# Patient Record
Sex: Female | Born: 1975 | Race: Black or African American | Hispanic: No | Marital: Married | Smoking: Never smoker
Health system: Southern US, Community
[De-identification: ages and names within clinical notes are randomized; demographics above are authoritative.]

## PROBLEM LIST (undated history)

## (undated) DIAGNOSIS — IMO0002 Reserved for concepts with insufficient information to code with codable children: Secondary | ICD-10-CM

## (undated) DIAGNOSIS — G4730 Sleep apnea, unspecified: Secondary | ICD-10-CM

## (undated) DIAGNOSIS — D649 Anemia, unspecified: Secondary | ICD-10-CM

## (undated) DIAGNOSIS — E8881 Metabolic syndrome: Secondary | ICD-10-CM

## (undated) DIAGNOSIS — E282 Polycystic ovarian syndrome: Secondary | ICD-10-CM

## (undated) DIAGNOSIS — M5126 Other intervertebral disc displacement, lumbar region: Secondary | ICD-10-CM

## (undated) HISTORY — DX: Sleep apnea, unspecified: G47.30

## (undated) HISTORY — DX: Other intervertebral disc displacement, lumbar region: M51.26

## (undated) HISTORY — DX: Reserved for concepts with insufficient information to code with codable children: IMO0002

## (undated) HISTORY — DX: Anemia, unspecified: D64.9

## (undated) HISTORY — DX: Metabolic syndrome: E88.81

## (undated) HISTORY — DX: Polycystic ovarian syndrome: E28.2

---

## 2006-06-19 ENCOUNTER — Emergency Department (HOSPITAL_COMMUNITY): Admission: EM | Admit: 2006-06-19 | Discharge: 2006-06-19 | Payer: Self-pay | Admitting: *Deleted

## 2011-10-29 ENCOUNTER — Other Ambulatory Visit (HOSPITAL_BASED_OUTPATIENT_CLINIC_OR_DEPARTMENT_OTHER): Payer: Self-pay | Admitting: Family Medicine

## 2011-10-30 ENCOUNTER — Ambulatory Visit (INDEPENDENT_AMBULATORY_CARE_PROVIDER_SITE_OTHER)
Admission: RE | Admit: 2011-10-30 | Discharge: 2011-10-30 | Disposition: A | Discharge status: Home / Self Care | Payer: Private Health Insurance - Indemnity | Source: Ambulatory Visit | Attending: Family Medicine | Admitting: Family Medicine

## 2011-10-30 ENCOUNTER — Ambulatory Visit (HOSPITAL_BASED_OUTPATIENT_CLINIC_OR_DEPARTMENT_OTHER)
Admission: RE | Admit: 2011-10-30 | Discharge: 2011-10-30 | Disposition: A | Discharge status: Home / Self Care | Payer: Private Health Insurance - Indemnity | Source: Ambulatory Visit | Attending: Family Medicine | Admitting: Family Medicine

## 2011-10-30 ENCOUNTER — Other Ambulatory Visit (HOSPITAL_BASED_OUTPATIENT_CLINIC_OR_DEPARTMENT_OTHER): Payer: Self-pay | Admitting: Family Medicine

## 2011-10-30 DIAGNOSIS — R1032 Left lower quadrant pain: Secondary | ICD-10-CM

## 2011-10-30 DIAGNOSIS — R319 Hematuria, unspecified: Secondary | ICD-10-CM

## 2011-11-19 ENCOUNTER — Encounter: Payer: Self-pay | Admitting: Internal Medicine

## 2011-11-19 ENCOUNTER — Ambulatory Visit (INDEPENDENT_AMBULATORY_CARE_PROVIDER_SITE_OTHER): Payer: Private Health Insurance - Indemnity | Admitting: Internal Medicine

## 2011-11-19 DIAGNOSIS — G4730 Sleep apnea, unspecified: Secondary | ICD-10-CM

## 2011-11-19 DIAGNOSIS — M5126 Other intervertebral disc displacement, lumbar region: Secondary | ICD-10-CM

## 2011-11-19 DIAGNOSIS — E282 Polycystic ovarian syndrome: Secondary | ICD-10-CM

## 2011-11-19 DIAGNOSIS — M109 Gout, unspecified: Secondary | ICD-10-CM

## 2011-11-19 DIAGNOSIS — N979 Female infertility, unspecified: Secondary | ICD-10-CM

## 2011-11-19 LAB — CBC WITH DIFFERENTIAL/PLATELET
Eosinophils Absolute: 0.2 10*3/uL (ref 0.0–0.7)
Hemoglobin: 11.6 g/dL — ABNORMAL LOW (ref 12.0–15.0)
Lymphs Abs: 3.9 10*3/uL (ref 0.7–4.0)
MCH: 27.2 pg (ref 26.0–34.0)
Monocytes Relative: 7 % (ref 3–12)
Neutro Abs: 7.3 10*3/uL (ref 1.7–7.7)
Neutrophils Relative %: 60 % (ref 43–77)
RBC: 4.26 MIL/uL (ref 3.87–5.11)

## 2011-11-19 LAB — COMPREHENSIVE METABOLIC PANEL
Albumin: 4 g/dL (ref 3.5–5.2)
CO2: 27 mEq/L (ref 19–32)
Calcium: 9.4 mg/dL (ref 8.4–10.5)
Chloride: 105 mEq/L (ref 96–112)
Glucose, Bld: 68 mg/dL — ABNORMAL LOW (ref 70–99)
Potassium: 4.3 mEq/L (ref 3.5–5.3)
Sodium: 140 mEq/L (ref 135–145)
Total Protein: 7.2 g/dL (ref 6.0–8.3)

## 2011-11-19 LAB — URIC ACID: Uric Acid, Serum: 6.8 mg/dL (ref 2.4–7.0)

## 2011-11-19 LAB — TSH: TSH: 0.761 u[IU]/mL (ref 0.350–4.500)

## 2011-11-19 NOTE — Progress Notes (Signed)
  Subjective:    Patient ID: Sabrina Avery, female    DOB: 04-14-1976, 35 y.o.   MRN: [id number]  HPI  New pt here for first visit.    Former care at [facility] ?  Dr. Katrinka Blazing, GYn Dr. Lindalou Hose in [place] and she sees an infertility specialist.  PMH of PCOS, infertility, obesity,   OSA but does not use CPAP, remote HNP lower back, and she was told by her Daymon Larsen MD that she may have gout  She is concerned over two issues. She was told at [facility] that her uric acid level was high.  Shehas never had any swollen red or hot joints.  She was not sure what this means  She also would like to talk to a nutritionist about wtl loss issues.  She has tried weight watchers in the past  No Known Allergies Past Medical History  Diagnosis Date  . Gout   . Insulin resistance   . Sleep apnea    History reviewed. No pertinent past surgical history. History   Social History  . Marital Status: Married    Spouse Name: N/A    Number of Children: N/A  . Years of Education: N/A   Occupational History  . Not on file.   Social History Main Topics  . Smoking status: Never Smoker   . Smokeless tobacco: Never Used  . Alcohol Use: No  . Drug Use: No  . Sexually Active: Yes    Birth Control/ Protection: None   Other Topics Concern  . Not on file   Social History Narrative  . No narrative on file   Family History  Problem Relation Age of Onset  . Hyperlipidemia Mother   . Diabetes Father   . Hypertension Brother    There is no problem list on file for this patient.  No current outpatient prescriptions on file prior to visit.      Review of Systems    see hpi Objective:   Physical Exam Physical Exam  Nursing note and vitals reviewed.  Constitutional: She is oriented to person, place, and time. She appears well-developed and well-nourished.  HENT:  Head: Normocephalic and atraumatic.  Cardiovascular: Normal rate and regular rhythm. Exam reveals no gallop and no friction rub.  No murmur  heard.  Pulmonary/Chest: Breath sounds normal. She has no wheezes. She has no rales.  Neurological: She is alert and oriented to person, place, and time.  Skin: Skin is warm and dry.  Psychiatric: She has a normal mood and affect. Her behavior is normal.  M/S  I see not acitve synovitis to any of her joints            Assessment & Plan:  1)  Elevated uric acid.  Will recheck today.  Gout actually not confirmed as she has never had any active synovitis to any joint 2)  Obesity  Will refer to nutritionist 3)  PCOS  On Metformin 4)  Infertility 5)  Remote HNP lower back

## 2011-11-19 NOTE — Patient Instructions (Signed)
Will make referral to nutritionist for weight loss  Sign old records GYN MD and Elana Alm care MD  Schedule CPE  For 2013  Lams will be mailed to you

## 2011-11-20 ENCOUNTER — Encounter: Payer: Self-pay | Admitting: Emergency Medicine

## 2011-11-20 DIAGNOSIS — N979 Female infertility, unspecified: Secondary | ICD-10-CM | POA: Insufficient documentation

## 2011-12-24 ENCOUNTER — Encounter: Payer: Private Health Insurance - Indemnity | Attending: Internal Medicine | Admitting: *Deleted

## 2011-12-24 ENCOUNTER — Encounter: Payer: Self-pay | Admitting: *Deleted

## 2011-12-24 DIAGNOSIS — Z713 Dietary counseling and surveillance: Secondary | ICD-10-CM | POA: Insufficient documentation

## 2011-12-24 NOTE — Progress Notes (Signed)
  Medical Nutrition Therapy:  Appt start time: 1530 end time:  1630.   Assessment:  Primary concerns today: Patient here for help with weight loss. She works full time and eats out often with husband. She is on Metformin for PCOS. Not physically active at this time   MEDICATIONS: see list.    DIETARY INTAKE:  Usual eating pattern includes 2-3 meals and 1-3 snacks per day.  Everyday foods include fair variety of most food groups.  Avoided foods include beef and pork.    24-hr recall:  B ( AM): used to skip, now: yogurt OR Chic Filet Burrito or chix biscuit  Snk ( AM): occasionally yogurt, crax and string cheese  L ( PM): home made soup with chix and veg, OR fast food restaurant Snk ( PM): occasionally yogurt or string cheese D ( PM): lean meat; chicken or fish, starch, vegetables, salad (raspberry or vidalia onion) Snk ( PM): none Beverages: tea, water, small sips of soda  Usual physical activity: Plans to do sit ups, push ups and weights 5 days a week. Currently doing twice weekly  Estimated energy needs: 1400 calories 158 g carbohydrates 105 g protein 39 g fat  Progress Towards Goal(s):  In progress.   Nutritional Diagnosis:  NI-1.5 Excessive energy intake As related to obesity.  As evidenced by BMI of 46.6%.    Intervention:  Nutrition counseling provided on macronutrients and balance for appropriate weight loss. Recommend she aim for 30-45 grams Carb / meal and 0-15 grams for snacks if hungry. Discussed value of choosing lower fat foods and avoiding fried foods Also discussed value of increasing her activity level as tolerated to 5 days/week to provide weight loss of 1-2 pounds per week . Goals: Aim for 2-3 Carbs / meal, 0-1 / snack if hungry  Read Food Labels for Total Carb of foods at home Eat half of meal in restaurant and bring other half home for another meal later Look for opportunities to increase activity level by walking, or classes at gym  Handouts given during  visit include:  Carb Counting and Beyond handout, Label Reading handout  Menu Planner to practice food choices  Monitoring/Evaluation:  Dietary intake, exercise, reading food labels, and body weight prn.

## 2011-12-24 NOTE — Patient Instructions (Addendum)
Goals: Aim for 2-3 Carbs / meal, 0-1 / snack if hungry  Read Food Labels for Total Carb of foods at home Eat half of meal in restaurant and bring other half home for another meal later Look for opportunities to increase activity level by walking, or classes at gym

## 2011-12-25 ENCOUNTER — Encounter: Payer: Self-pay | Admitting: *Deleted

## 2012-01-21 ENCOUNTER — Encounter: Payer: Self-pay | Admitting: Internal Medicine

## 2012-01-21 ENCOUNTER — Ambulatory Visit (INDEPENDENT_AMBULATORY_CARE_PROVIDER_SITE_OTHER): Payer: Private Health Insurance - Indemnity | Admitting: Internal Medicine

## 2012-01-21 VITALS — BP 110/70 | HR 101 | Temp 97.2°F | Ht 66.0 in | Wt 286.0 lb

## 2012-01-21 DIAGNOSIS — E282 Polycystic ovarian syndrome: Secondary | ICD-10-CM

## 2012-01-21 DIAGNOSIS — Z0000 Encounter for general adult medical examination without abnormal findings: Secondary | ICD-10-CM

## 2012-01-21 DIAGNOSIS — L304 Erythema intertrigo: Secondary | ICD-10-CM | POA: Insufficient documentation

## 2012-01-21 DIAGNOSIS — L538 Other specified erythematous conditions: Secondary | ICD-10-CM

## 2012-01-21 LAB — POCT URINALYSIS DIPSTICK
Nitrite, UA: NEGATIVE
Urobilinogen, UA: NEGATIVE
pH, UA: 6

## 2012-01-21 MED ORDER — NYSTATIN-TRIAMCINOLONE 100000-0.1 UNIT/GM-% EX OINT: TOPICAL_OINTMENT | Freq: Two times a day (BID) | CUTANEOUS | Status: DC

## 2012-01-21 NOTE — Patient Instructions (Signed)
Increase exercise.  Follow dash diet  See me as needed

## 2012-01-21 NOTE — Progress Notes (Signed)
Subjective:    Patient ID: Sabrina Avery, female    DOB: 12-18-1976, 36 y.o.   MRN: [id number]  HPI  [name] is here for comprehensive exam.  Overall doing well.  She did see nutritionist who recommended diet for her - she has not started an exercise program as yet.   She was inquiring about a Candida diet.    She has had long standing skin rash that she has seen several dermatolgists for in [place].  She currently has an itchy rash under both breasts.  Long standing.  She also has several dark spots flat on breasts, hips, and lower abd.  She is under care of a dermatolgist for this.   No redness or swelling in joints.    She states she will schedule a pap smear appt next month with her GYN in [place]  No Known Allergies Past Medical History  Diagnosis Date  . Insulin resistance   . Sleep apnea   . Gout     high uric acid.  per pt report  . Lumbar disc herniation     per pt report  . Polycystic ovarian syndrome   . Sleep apnea syndrome    No past surgical history on file. History   Social History  . Marital Status: Married    Spouse Name: N/A    Number of Children: N/A  . Years of Education: N/A   Occupational History  . Not on file.   Social History Main Topics  . Smoking status: Never Smoker   . Smokeless tobacco: Never Used  . Alcohol Use: No  . Drug Use: No  . Sexually Active: Yes    Birth Control/ Protection: None   Other Topics Concern  . Not on file   Social History Narrative  . No narrative on file   Family History  Problem Relation Age of Onset  . Hyperlipidemia Mother   . Diabetes Father   . Hypertension Brother    Patient Active Problem List  Diagnoses  . Gout  . Sleep apnea  . Lumbar disc herniation  . Polycystic ovarian syndrome  . Morbid obesity  . Infertility, female  . Intertrigo   Current Outpatient Prescriptions on File Prior to Visit  Medication Sig Dispense Refill  . Multiple Vitamin (MULTIVITAMIN) tablet Take 1 tablet by mouth daily.         . progesterone (PROMETRIUM) 200 MG capsule Take 2 tablets by mouth Daily. For first 10 days of cycle      . metFORMIN (GLUCOPHAGE) 1000 MG tablet Take 2,000 mg by mouth daily with breakfast.           Review of Systems  Constitutional: Negative for chills, diaphoresis and appetite change.  HENT: Negative for nosebleeds, congestion and tinnitus.   Eyes: Negative for redness.  Respiratory: Negative for cough, chest tightness and shortness of breath.   Cardiovascular: Negative for chest pain.  Gastrointestinal: Negative for abdominal pain.  Genitourinary: Negative for dysuria.  Musculoskeletal: Negative for joint swelling.  Skin: Positive for rash.  Hematological: Does not bruise/bleed easily.  Psychiatric/Behavioral: Negative for behavioral problems. The patient is not nervous/anxious.        Objective:   Physical Exam Physical Exam  Nursing note and vitals reviewed.  Constitutional: She is oriented to person, place, and time. She appears well-developed and well-nourished.  HENT:  Head: Normocephalic and atraumatic.  Right Ear: Tympanic membrane and ear canal normal. No drainage. Tympanic membrane is not injected and not erythematous.  Left  Ear: Tympanic membrane and ear canal normal. No drainage. Tympanic membrane is not injected and not erythematous.  Nose: Nose normal. Right sinus exhibits no maxillary sinus tenderness and no frontal sinus tenderness. Left sinus exhibits no maxillary sinus tenderness and no frontal sinus tenderness.  Mouth/Throat: Oropharynx is clear and moist. No oral lesions. No oropharyngeal exudate.  Eyes: Conjunctivae and EOM are normal. Pupils are equal, round, and reactive to light.  Neck: Normal range of motion. Neck supple. No JVD present. Carotid bruit is not present. No mass and no thyromegaly present.  Cardiovascular: Normal rate, regular rhythm, S1 normal, S2 normal and intact distal pulses. Exam reveals no gallop and no friction rub.  No murmur  heard.  Pulses:  Carotid pulses are 2+ on the right side, and 2+ on the left side.  Dorsalis pedis pulses are 2+ on the right side, and 2+ on the left side.  No carotid bruit. No LE edema  Pulmonary/Chest: Breath sounds normal. She has no wheezes. She has no rales. She exhibits no tenderness.  Abdominal: Soft. Bowel sounds are normal. She exhibits no distension and no mass. There is no hepatosplenomegaly. There is no tenderness. There is no CVA tenderness.  Musculoskeletal: Normal range of motion.  No active synovitis to joints.  Lymphadenopathy:  She has no cervical adenopathy.  She has no axillary adenopathy.  Right: No inguinal and no supraclavicular adenopathy present.  Left: No inguinal and no supraclavicular adenopathy present.  Neurological: She is alert and oriented to person, place, and time. She has normal strength and normal reflexes. She displays no tremor. No cranial nerve deficit or sensory deficit. Coordination and gait normal.  Skin: Skin is warm and dry. No rash noted. No cyanosis. Nails show no clubbing.  Psychiatric: She has a normal mood and affect. Her speech is normal and behavior is normal. Cognition and memory are normal.           Assessment & Plan:  1)  Health Maintenance:  See scanned sheet.  Will give TDap today.  She declines flu vaccine 2)  Morbid obesity.  I counseled that I am unaware of Candida diet and she can discuss this with nutritionist.  I gave copy of dash diet and encouraged exercise.  3)  Intertrigo   Mycolog bid per order 4)  PCOS/Infertility  Followed by GYn in [place]

## 2012-04-22 ENCOUNTER — Encounter: Payer: Self-pay | Admitting: Internal Medicine

## 2012-04-22 ENCOUNTER — Ambulatory Visit (HOSPITAL_BASED_OUTPATIENT_CLINIC_OR_DEPARTMENT_OTHER)
Admission: RE | Admit: 2012-04-22 | Discharge: 2012-04-22 | Disposition: A | Discharge status: Home / Self Care | Payer: Private Health Insurance - Indemnity | Source: Ambulatory Visit | Attending: Internal Medicine | Admitting: Internal Medicine

## 2012-04-22 ENCOUNTER — Other Ambulatory Visit: Payer: Self-pay | Admitting: Internal Medicine

## 2012-04-22 ENCOUNTER — Ambulatory Visit (INDEPENDENT_AMBULATORY_CARE_PROVIDER_SITE_OTHER): Payer: Private Health Insurance - Indemnity | Admitting: Internal Medicine

## 2012-04-22 VITALS — BP 103/68 | HR 64 | Temp 98.3°F | Resp 16 | Ht 66.5 in | Wt 290.0 lb

## 2012-04-22 DIAGNOSIS — M25569 Pain in unspecified knee: Secondary | ICD-10-CM

## 2012-04-22 DIAGNOSIS — M25469 Effusion, unspecified knee: Secondary | ICD-10-CM

## 2012-04-22 DIAGNOSIS — N644 Mastodynia: Secondary | ICD-10-CM

## 2012-04-22 DIAGNOSIS — M898X9 Other specified disorders of bone, unspecified site: Secondary | ICD-10-CM

## 2012-04-22 NOTE — Patient Instructions (Signed)
See me in 4-6 weeks  Labs will be mailed to you

## 2012-04-22 NOTE — Progress Notes (Signed)
Subjective:    Patient ID: Sabrina Avery, female    DOB: 05/17/1976, 36 y.o.   MRN: [id number]  HPI  Sabrina Avery is here for acute visit.   She is having knee pain over the last several weeks bilaterally.  No redness or swelling.  Hears her knees popping.  No injury or trauma.  Take 2 Alleve per day  Also noted R sided breast pain one month ago.  Not sure if she feels a mass  No FH of breast cancer.  No nipple discharge reported   She did see Dr. Kinnie Scales for weight loss one visit but has not been back  No Known Allergies Past Medical History  Diagnosis Date  . Insulin resistance   . Sleep apnea   . Gout     high uric acid.  per pt report  . Lumbar disc herniation     per pt report  . Polycystic ovarian syndrome   . Sleep apnea syndrome    History reviewed. No pertinent past surgical history. History   Social History  . Marital Status: Married    Spouse Name: N/A    Number of Children: N/A  . Years of Education: N/A   Occupational History  . Not on file.   Social History Main Topics  . Smoking status: Never Smoker   . Smokeless tobacco: Never Used  . Alcohol Use: No  . Drug Use: No  . Sexually Active: Yes    Birth Control/ Protection: None   Other Topics Concern  . Not on file   Social History Narrative  . No narrative on file   Family History  Problem Relation Age of Onset  . Hyperlipidemia Mother   . Diabetes Father   . Hypertension Brother    Patient Active Problem List  Diagnoses  . Gout  . Sleep apnea  . Lumbar disc herniation  . Polycystic ovarian syndrome  . Morbid obesity  . Infertility, female  . Intertrigo  . Knee pain   Current Outpatient Prescriptions on File Prior to Visit  Medication Sig Dispense Refill  . Multiple Vitamin (MULTIVITAMIN) tablet Take 1 tablet by mouth daily.        . progesterone (PROMETRIUM) 200 MG capsule Take 2 tablets by mouth Daily. For first 10 days of cycle          Physical Exam  Nursing note and vitals reviewed.   Constitutional: She is oriented to person, place, and time. She appears well-developed and well-nourished.  HENT:  Head: Normocephalic and atraumatic.  Cardiovascular: Normal rate and regular rhythm. Exam reveals no gallop and no friction rub.  No murmur heard.  Pulmonary/Chest: Breath sounds normal. She has no wheezes. She has no rales.  Breast exam. Careful exam of both breasts.  No discrete mass no nipple discharge no axillary adenopathy bilaterally Neurological: She is alert and oriented to person, place, and time.  Skin: Skin is warm and dry.  Psychiatric: She has a normal mood and affect. Her behavior is normal.  M/S  No active synovitis to any joint.  Knees pos bilateral crepitus.  Lachmanns neg.  No posterior swelling        Review of Systems See HPI    Objective:   Physical Exam           Assessment & Plan:  1)  Knee pain  Check arthirits panel   Will get imagaing today. Ok for [employer] 2 tabs bid 2)  Breast discomfort:  I do not palpate any  mass or thickening.  I counseled pt the only way to futher evaluate is to get a diagnostic mammogram and ultrasound if needed but she declines today.  Counseled if any further self exam abnormalities  To see me.  She voices understanding

## 2012-04-27 ENCOUNTER — Telehealth: Payer: Self-pay | Admitting: Internal Medicine

## 2012-04-27 DIAGNOSIS — M25569 Pain in unspecified knee: Secondary | ICD-10-CM

## 2012-04-27 DIAGNOSIS — M779 Enthesopathy, unspecified: Secondary | ICD-10-CM

## 2012-04-27 NOTE — Telephone Encounter (Signed)
Forwarded to Dr. Constance Goltz per her request

## 2012-04-27 NOTE — Telephone Encounter (Signed)
Pt clled in 820 am 04/27/2012 to get her xray results. Please call (c) [phone] or her work [phone]. Thanks

## 2012-04-27 NOTE — Telephone Encounter (Signed)
Spoke with pt. And informed of knee x-ray results.  Has small leffusion  Will refer to Dr. Charlett Blake.  Pt states alleve is helping

## 2012-05-06 ENCOUNTER — Ambulatory Visit (INDEPENDENT_AMBULATORY_CARE_PROVIDER_SITE_OTHER): Payer: Private Health Insurance - Indemnity | Admitting: Obstetrics and Gynecology

## 2012-05-06 ENCOUNTER — Encounter: Payer: Self-pay | Admitting: Obstetrics and Gynecology

## 2012-05-06 VITALS — BP 112/70 | Ht 66.0 in | Wt 291.0 lb

## 2012-05-06 DIAGNOSIS — E282 Polycystic ovarian syndrome: Secondary | ICD-10-CM

## 2012-05-06 DIAGNOSIS — N979 Female infertility, unspecified: Secondary | ICD-10-CM

## 2012-05-06 LAB — POCT URINE PREGNANCY: Preg Test, Ur: NEGATIVE

## 2012-05-06 NOTE — Progress Notes (Signed)
Subjective:    Sabrina Avery is a 36 y.o. female G0P0 who presents for evaluation of infertility. Patient and partner have been attempting conception since  2011. Partner: Molly Maduro is 105 years old does not have children. Couple has been attempting conception since January 2011. Pregnancies with current partner: no. Diagnosed with PCOS 2 years ago, was previously treated with Metformin. Seen last year at [facility]: PCOS confirmed and teratospermia diagnosed. Was trying to lose weight and using cyclic progesterone. Previous use of Clomid up to 100 mg without proven ovulation.  Menstrual history:   LMP:  Patient's last menstrual period was 03/06/2012. Menarche:  36 years old Menstrual cycle:  does not cycle without progesterone supplement Pelvic pain: none  Endocrine history:   Basal body temperature Biphasic never done  TSH normal December 2012  Prolactin never done  [facility] N/A   Day 21 progesterone low  May 2012  Hysterosalpyngogram never done   Glucose:Insulin ratio abnormal  known insulin resistant May 2012  Semen analysis abnormal  teratospermiaDecember 2012  Medications clomid for 5 cycles; last cycle 1 year ago.  Other therapies Not applicable  Insemination not applicable   Obstetrical history:   Never pregnant  Contraception history:   Oral contraceptives: Past: yes None for 4 years   Habits:   Patient reports:  History   Social History  . Marital Status: Married    Spouse Name: N/A    Number of Children: N/A  . Years of Education: N/A   Social History Main Topics  . Smoking status: Never Smoker   . Smokeless tobacco: Never Used  . Alcohol Use: No     occasionally wine  . Drug Use: No  . Sexually Active: Yes    Birth Control/ Protection: None   Other Topics Concern  . None   Social History Narrative  . None   Partner reports: no alcohol use, caffeine, no tobacco  The following portions of the patient's history were reviewed and updated as  appropriate: allergies, current medications, past family history, past medical history, past social history, past surgical history and problem list.  Review of Systems Pertinent items are noted in HPI.   Objective:     BP 112/70  Ht 5\' 6"  (1.676 m)  Wt 291 lb (131.997 kg)  BMI 46.97 kg/m2  LMP 03/06/2012   Wt Readings from Last 1 Encounters:  05/06/12 291 lb (131.997 kg)    BMI: Body mass index is 46.97 kg/(m^2). General Appearance: Alert, appropriate appearance for age. No acute distress HEENT: Grossly normal Neck / Thyroid: Supple, no masses, nodes or enlargement Lungs: clear to auscultation bilaterally Back: No CVA tenderness Cardiovascular: Regular rate and rhythm. S1, S2, no murmur Gastrointestinal: Soft, non-tender, no masses or organomegaly Pelvic Exam: Vulva and vagina appear normal. Bimanual exam reveals normal uterus and adnexa. Rectovaginal: not indicated Lymphatic Exam: Non-palpable nodes in neck, clavicular, axillary, or inguinal regions Skin: no rash or abnormalities Neurologic: Normal gait and speech, no tremor  Psychiatric: Alert and oriented, appropriate affect.    Assessment:    Primary infertility, Obesity Suspected / known ovulation factor.   Plan:   Tests ordered:   ROI from previous lab work  Treatment:   Metformin 1000 mg BID . Pt to call after 1 full month to have Day 1-2-3 exam to restart Clomid at 100 mg with Day 21 Progesterone   Donzel Romack AMD 5/16/201310:27 AM

## 2012-05-10 LAB — PAP IG W/ RFLX HPV ASCU

## 2012-06-03 ENCOUNTER — Ambulatory Visit: Payer: Private Health Insurance - Indemnity | Admitting: Internal Medicine

## 2012-06-14 ENCOUNTER — Telehealth: Payer: Self-pay | Admitting: Obstetrics and Gynecology

## 2012-06-14 NOTE — Telephone Encounter (Signed)
Triage/epic 

## 2012-06-15 ENCOUNTER — Telehealth: Payer: Self-pay

## 2012-06-15 NOTE — Telephone Encounter (Signed)
PC from pt regarding lab work and Rx.  Pt needs refill on Metformin and d21 progesterone sch for July 11 at 9:15.  Missed 1-2-3 exam due to cycle falling over the weekend.  Ok to do refill?  Does she need to continue Progesterone monthly to start cycle?  ld

## 2012-06-16 NOTE — Telephone Encounter (Signed)
sr pt 

## 2012-06-17 NOTE — Telephone Encounter (Signed)
OK to refill Metformin Will wait for Day 21 progesterone to decide dosage of Clomid

## 2012-06-18 ENCOUNTER — Other Ambulatory Visit: Payer: Self-pay

## 2012-06-19 ENCOUNTER — Other Ambulatory Visit: Payer: Self-pay | Admitting: Obstetrics and Gynecology

## 2012-06-21 MED ORDER — METFORMIN HCL 1000 MG PO TABS: 1000.0000 mg | ORAL_TABLET | Freq: Two times a day (BID) | ORAL | Status: AC

## 2012-06-21 NOTE — Telephone Encounter (Signed)
Metformin refilled ok per SR.  Pt notified that Clomid will be revisited when D21 progesterone is in.  Lab scheduled for 07-01-12.  Pt agreeable.  ld

## 2012-06-22 NOTE — Telephone Encounter (Signed)
Can pt get refill on rx

## 2012-06-29 ENCOUNTER — Telehealth: Payer: Self-pay | Admitting: Obstetrics and Gynecology

## 2012-06-29 NOTE — Telephone Encounter (Signed)
LAURA/FOLLOW/UP

## 2012-06-29 NOTE — Telephone Encounter (Signed)
LMTC @ 12:30  ld

## 2012-06-30 NOTE — Telephone Encounter (Signed)
Notified pt that we r/c faxed records from her.  Pt appt tomorrow with SR.  ld

## 2012-07-01 ENCOUNTER — Other Ambulatory Visit: Payer: Private Health Insurance - Indemnity

## 2012-07-01 ENCOUNTER — Other Ambulatory Visit: Payer: Self-pay

## 2012-07-01 DIAGNOSIS — N979 Female infertility, unspecified: Secondary | ICD-10-CM

## 2012-07-01 LAB — PROGESTERONE: Progesterone: 0.2 ng/mL

## 2012-07-01 NOTE — Progress Notes (Signed)
Quick Note:  Please call pt to inform needs Day 1,2,3 exam to start Clomid  ______

## 2012-07-01 NOTE — Progress Notes (Signed)
Quick Note:  Teratospermia. Consider IUI. To review at next visit. ______

## 2012-07-02 ENCOUNTER — Telehealth: Payer: Self-pay

## 2012-07-02 NOTE — Telephone Encounter (Signed)
LMTC @ 10:00  ld

## 2012-07-02 NOTE — Telephone Encounter (Signed)
Message copied by Larwance Rote on Fri Jul 02, 2012  9:57 AM ------      Message from: Silverio Lay      Created: Thu Jul 01, 2012 10:48 PM       Please call pt to inform needs Day 1,2,3 exam to start Clomid

## 2012-07-07 ENCOUNTER — Encounter: Payer: Private Health Insurance - Indemnity | Admitting: Obstetrics and Gynecology

## 2012-07-22 ENCOUNTER — Ambulatory Visit: Payer: Private Health Insurance - Indemnity | Attending: Orthopedic Surgery | Admitting: Rehabilitation

## 2012-07-22 DIAGNOSIS — IMO0001 Reserved for inherently not codable concepts without codable children: Secondary | ICD-10-CM | POA: Insufficient documentation

## 2012-07-22 DIAGNOSIS — M25579 Pain in unspecified ankle and joints of unspecified foot: Secondary | ICD-10-CM | POA: Insufficient documentation

## 2012-07-22 DIAGNOSIS — M25669 Stiffness of unspecified knee, not elsewhere classified: Secondary | ICD-10-CM | POA: Insufficient documentation

## 2012-07-27 ENCOUNTER — Ambulatory Visit: Payer: Private Health Insurance - Indemnity | Admitting: Rehabilitation

## 2012-07-29 ENCOUNTER — Ambulatory Visit: Payer: Private Health Insurance - Indemnity | Admitting: Rehabilitation

## 2012-07-29 ENCOUNTER — Telehealth: Payer: Self-pay | Admitting: Obstetrics and Gynecology

## 2012-07-29 NOTE — Telephone Encounter (Signed)
Triage/appt req. °

## 2012-07-29 NOTE — Telephone Encounter (Signed)
Pt call needing Day 1-2-3 exam, lmp 07/28/12. Pt scheduled with EP on 07/30/12 at 1:45 for exam, Dr. Lynford Humphrey not available. Pt agreed to appt. Time. Quinn Axe

## 2012-07-30 ENCOUNTER — Ambulatory Visit (INDEPENDENT_AMBULATORY_CARE_PROVIDER_SITE_OTHER): Payer: Private Health Insurance - Indemnity | Admitting: Obstetrics and Gynecology

## 2012-07-30 ENCOUNTER — Encounter: Payer: Self-pay | Admitting: Obstetrics and Gynecology

## 2012-07-30 VITALS — BP 110/68 | Wt 290.0 lb

## 2012-07-30 DIAGNOSIS — E282 Polycystic ovarian syndrome: Secondary | ICD-10-CM

## 2012-07-30 LAB — POCT URINE PREGNANCY: Preg Test, Ur: NEGATIVE

## 2012-07-30 MED ORDER — CLOMIPHENE CITRATE 50 MG PO TABS: ORAL_TABLET | ORAL | Status: DC

## 2012-07-30 NOTE — Progress Notes (Signed)
Pt is here for Day 1-2-3 ovary check.

## 2012-07-30 NOTE — Progress Notes (Signed)
36 YO for ovary check prior to Clomid therapy.  Patient has a history of PCOS and desires to conceive.   O: Abdomen: soft, non-tender      Pelvic: EGBUS-wnl, vagina-narrowed with normal rugae, bi-manual exam limited by habitus but no palpable masses or tenderness   A: PCOS-desire to conceive     Clomid Initiation  P; Clomid 50 mg  # 10 2 po daily starting tomorrow x 5 days      Day 21 Progesterone, 08/18/12      Patient to be notified after results of day 21 Progesterone for further instructions      Encouraged folic acid 400 mcg-1000 mcg daily but patient states that pills make her gag      RTO-as scheduled or prn  Dorla Guizar, PA-C

## 2012-07-30 NOTE — Patient Instructions (Signed)
   Take at least folic acid 400 mcg-1000 mcg

## 2012-08-03 ENCOUNTER — Ambulatory Visit: Payer: Private Health Insurance - Indemnity | Admitting: Rehabilitation

## 2012-08-05 ENCOUNTER — Ambulatory Visit: Payer: Private Health Insurance - Indemnity | Admitting: Rehabilitation

## 2012-08-10 ENCOUNTER — Ambulatory Visit: Payer: Private Health Insurance - Indemnity | Admitting: Rehabilitation

## 2012-08-11 ENCOUNTER — Ambulatory Visit: Payer: Private Health Insurance - Indemnity | Admitting: Rehabilitation

## 2012-08-12 ENCOUNTER — Encounter: Payer: Private Health Insurance - Indemnity | Admitting: Rehabilitation

## 2012-08-16 ENCOUNTER — Ambulatory Visit: Payer: Private Health Insurance - Indemnity | Admitting: Rehabilitation

## 2012-08-18 ENCOUNTER — Other Ambulatory Visit: Payer: Private Health Insurance - Indemnity

## 2012-08-18 DIAGNOSIS — E282 Polycystic ovarian syndrome: Secondary | ICD-10-CM

## 2012-08-18 LAB — PROGESTERONE: Progesterone: 2 ng/mL

## 2012-08-19 ENCOUNTER — Ambulatory Visit: Payer: Private Health Insurance - Indemnity | Admitting: Rehabilitation

## 2012-08-26 ENCOUNTER — Telehealth: Payer: Self-pay

## 2012-08-26 NOTE — Telephone Encounter (Signed)
LMTC @ 10:10  ld

## 2012-08-26 NOTE — Telephone Encounter (Signed)
PC from pt stating she is unable to take Metformin1000mg  bid.  States it makes her feel bad and causes heart palpitations.  Did ok with 500mg  bid.  Notified pt that her d21 progesterone was at 2.0 - not ovulatory.  She has a history of anovulation with 150 mg Clomid and has been to [facility] in the past. Pt wants to know what SR wants to do next.  Will consult with SR for instruction.  Pt agreeable.  May leave msg on either phone.  ld

## 2012-08-30 NOTE — Telephone Encounter (Signed)
Recommend return to [facility] to pursue options

## 2012-08-31 NOTE — Telephone Encounter (Signed)
LMTC. SR suggesting she return to [facility].  ld

## 2012-10-19 ENCOUNTER — Encounter: Payer: Self-pay | Admitting: Internal Medicine

## 2012-10-19 ENCOUNTER — Ambulatory Visit (INDEPENDENT_AMBULATORY_CARE_PROVIDER_SITE_OTHER): Payer: BC Managed Care – PPO | Admitting: Internal Medicine

## 2012-10-19 VITALS — BP 106/73 | HR 70 | Temp 97.1°F | Resp 18 | Ht 66.0 in | Wt 280.0 lb

## 2012-10-19 DIAGNOSIS — E790 Hyperuricemia without signs of inflammatory arthritis and tophaceous disease: Secondary | ICD-10-CM | POA: Insufficient documentation

## 2012-10-19 DIAGNOSIS — R7989 Other specified abnormal findings of blood chemistry: Secondary | ICD-10-CM

## 2012-10-19 DIAGNOSIS — E282 Polycystic ovarian syndrome: Secondary | ICD-10-CM

## 2012-10-19 DIAGNOSIS — M5126 Other intervertebral disc displacement, lumbar region: Secondary | ICD-10-CM

## 2012-10-19 NOTE — Progress Notes (Signed)
Subjective:    Patient ID: Sabrina Avery, female    DOB: 15-Mar-1976, 36 y.o.   MRN: [id number]  HPI  Chontel is here for TB test and to discuss adoption.    She as applied for [facility].  PCOS followed by Dr. Elesa Hacker  No back pain  No lifting limitaitons  Has new job at [employer] and likes it  No Known Allergies Past Medical History  Diagnosis Date  . Insulin resistance   . Sleep apnea   . Lumbar disc herniation     per pt report  . Polycystic ovarian syndrome   . Sleep apnea syndrome   . Anemia   . Infertility    No past surgical history on file. History   Social History  . Marital Status: Married    Spouse Name: N/A    Number of Children: N/A  . Years of Education: N/A   Occupational History  . Not on file.   Social History Main Topics  . Smoking status: Never Smoker   . Smokeless tobacco: Never Used  . Alcohol Use: No     occasionally wine  . Drug Use: No  . Sexually Active: Yes    Birth Control/ Protection: None   Other Topics Concern  . Not on file   Social History Narrative  . No narrative on file   Family History  Problem Relation Age of Onset  . Hyperlipidemia Mother   . Thyroid disease Mother   . Diabetes Father   . Hypertension Brother   . Diabetes Paternal Grandmother   . Heart failure Paternal Grandmother    Patient Active Problem List  Diagnosis  . Sleep apnea  . Lumbar disc herniation  . Polycystic ovarian syndrome  . Morbid obesity  . Infertility, female  . Intertrigo  . Knee pain  . Elevated uric acid in blood   Current Outpatient Prescriptions on File Prior to Visit  Medication Sig Dispense Refill  . calcium-vitamin D (OSCAL WITH D) 500-200 MG-UNIT per tablet Take 1 tablet by mouth continuous as needed.      . fish oil-omega-3 fatty acids 1000 MG capsule Take 1 g by mouth continuous as needed.      . folic acid (FOLVITE) 400 MCG tablet Take 400 mcg by mouth continuous as needed.      . progesterone (PROMETRIUM) 200  MG capsule Take 2 tablets by mouth Daily. For first 10 days of cycle      . Capsicum, Cayenne, (CAYENNE PEPPER) 450 MG CAPS Take by mouth continuous as needed.      . clomiPHENE (CLOMID) 50 MG tablet 2 tablets daily starting 07/31/12 x 5 days  10 tablet  0  . collodion LIQD Apply topically as needed.      . Grapefruit Seed 60 % EXTR by Does not apply route continuous as needed.      . Iron-Vitamins (GERITOL PO) Take by mouth continuous as needed.      . lactobacillus acidophilus (BACID) TABS Take 1 tablet by mouth continuous as needed.      . metFORMIN (GLUCOPHAGE) 1000 MG tablet Take 1 tablet (1,000 mg total) by mouth 2 (two) times daily with a meal.  60 tablet  11  . MOLYBDENUM PO Take by mouth continuous as needed.      . Multiple Vitamin (MULTIVITAMIN) tablet Take 1 tablet by mouth daily.        Pennie Banter Leaves POWD by Does not apply route continuous as needed.      Marland Kitchen  Prenatal Vit-Fe Fumarate-FA (PRENATAL MULTIVITAMIN) TABS Take 1 tablet by mouth continuous as needed.      . vitamin B-12 (CYANOCOBALAMIN) 50 MCG tablet Take 50 mcg by mouth once as needed.         Review of Systems See HPI    Objective:   Physical Exam  Physical Exam  Nursing note and vitals reviewed.  Constitutional: She is oriented to person, place, and time. She appears well-developed and well-nourished.  HENT:  Head: Normocephalic and atraumatic.  Cardiovascular: Normal rate and regular rhythm. Exam reveals no gallop and no friction rub.  No murmur heard.  Pulmonary/Chest: Breath sounds normal. She has no wheezes. She has no rales.  Neurological: She is alert and oriented to person, place, and time.  Skin: Skin is warm and dry.  Psychiatric: She has a normal mood and affect. Her behavior is normal.             Assessment & Plan:  TB  Test  Today  PCOS  Managed Dr. Elesa Hacker  Lumbar disc herniaton  No lifting limitaitons

## 2012-11-02 ENCOUNTER — Telehealth: Payer: Self-pay | Admitting: Internal Medicine

## 2012-11-02 NOTE — Telephone Encounter (Signed)
Pt has questions about her blood work.. Please call her at [phone]

## 2012-11-02 NOTE — Telephone Encounter (Signed)
Pt had labs drawn at work for benefits states that  There were abnormalities and she had questions. Pt will fax results to [place].

## 2012-11-07 ENCOUNTER — Encounter: Payer: Self-pay | Admitting: Internal Medicine

## 2012-11-07 ENCOUNTER — Telehealth: Payer: Self-pay | Admitting: Internal Medicine

## 2012-11-07 DIAGNOSIS — R748 Abnormal levels of other serum enzymes: Secondary | ICD-10-CM | POA: Insufficient documentation

## 2012-11-07 NOTE — Telephone Encounter (Signed)
Sabrina Avery  Call pt and let her know that I have seen her labs.  She will need to see me in office so I can examine her liver and her alkaline phosphatase needs to be repeated as well as her glucose.  Tell her to come in fasting.    Route back with appt date  Thanks

## 2012-11-08 NOTE — Telephone Encounter (Signed)
Pt has an appt for 11/26 at 0830

## 2012-11-12 ENCOUNTER — Encounter: Payer: Self-pay | Admitting: *Deleted

## 2012-11-15 ENCOUNTER — Telehealth: Payer: Self-pay | Admitting: *Deleted

## 2012-11-15 NOTE — Telephone Encounter (Signed)
Pt called to see if we could change her appt time for bad wheather

## 2012-11-16 ENCOUNTER — Ambulatory Visit (INDEPENDENT_AMBULATORY_CARE_PROVIDER_SITE_OTHER): Payer: BC Managed Care – PPO | Admitting: Internal Medicine

## 2012-11-16 ENCOUNTER — Telehealth: Payer: Self-pay | Admitting: *Deleted

## 2012-11-16 ENCOUNTER — Encounter: Payer: Self-pay | Admitting: Internal Medicine

## 2012-11-16 VITALS — BP 109/73 | HR 80 | Temp 97.6°F | Resp 16 | Wt 281.0 lb

## 2012-11-16 DIAGNOSIS — R748 Abnormal levels of other serum enzymes: Secondary | ICD-10-CM

## 2012-11-16 DIAGNOSIS — L259 Unspecified contact dermatitis, unspecified cause: Secondary | ICD-10-CM

## 2012-11-16 DIAGNOSIS — L309 Dermatitis, unspecified: Secondary | ICD-10-CM

## 2012-11-16 LAB — HEPATIC FUNCTION PANEL
Alkaline Phosphatase: 102 U/L (ref 39–117)
Indirect Bilirubin: 0.3 mg/dL (ref 0.0–0.9)
Total Bilirubin: 0.4 mg/dL (ref 0.3–1.2)
Total Protein: 7.4 g/dL (ref 6.0–8.3)

## 2012-11-16 MED ORDER — HYDROCORTISONE 2.5 % EX CREA: TOPICAL_CREAM | Freq: Two times a day (BID) | CUTANEOUS | Status: DC

## 2012-11-16 NOTE — Progress Notes (Signed)
Subjective:    Patient ID: Sabrina Avery, female    DOB: 1976-09-12, 36 y.o.   MRN: [id number]  HPI Sabrina Avery is here for acute visit and to follow up on elevated alkaline phosphatase.  Labs drawn from work shows minimally elevated alkaline phosphatase at 120.  She is asymptomatic  She also has itchy rash flexoral surface of each elbow.    She received a clomid injection 2 weeks ago for infertility Dr. Elesa Hacker.  No Known Allergies Past Medical History  Diagnosis Date  . Insulin resistance   . Sleep apnea   . Lumbar disc herniation     per pt report  . Polycystic ovarian syndrome   . Sleep apnea syndrome   . Anemia   . Infertility    History reviewed. No pertinent past surgical history. History   Social History  . Marital Status: Married    Spouse Name: N/A    Number of Children: N/A  . Years of Education: N/A   Occupational History  . Not on file.   Social History Main Topics  . Smoking status: Never Smoker   . Smokeless tobacco: Never Used  . Alcohol Use: No     Comment: occasionally wine  . Drug Use: No  . Sexually Active: Yes    Birth Control/ Protection: None   Other Topics Concern  . Not on file   Social History Narrative  . No narrative on file   Family History  Problem Relation Age of Onset  . Hyperlipidemia Mother   . Thyroid disease Mother   . Diabetes Father   . Hypertension Brother   . Diabetes Paternal Grandmother   . Heart failure Paternal Grandmother    Patient Active Problem List  Diagnosis  . Sleep apnea  . Lumbar disc herniation  . Polycystic ovarian syndrome  . Morbid obesity  . Infertility, female  . Intertrigo  . Knee pain  . Elevated uric acid in blood  . Elevated alkaline phosphatase measurement  . Eczema   Current Outpatient Prescriptions on File Prior to Visit  Medication Sig Dispense Refill  . calcium-vitamin D (OSCAL WITH D) 500-200 MG-UNIT per tablet Take 1 tablet by mouth continuous as needed.      . Prenatal Vit-Fe  Fumarate-FA (PRENATAL MULTIVITAMIN) TABS Take 1 tablet by mouth continuous as needed.      . progesterone (PROMETRIUM) 200 MG capsule Take 2 tablets by mouth Daily. For first 10 days of cycle      . Capsicum, Cayenne, (CAYENNE PEPPER) 450 MG CAPS Take by mouth continuous as needed.      . clomiPHENE (CLOMID) 50 MG tablet 2 tablets daily starting 07/31/12 x 5 days  10 tablet  0  . collodion LIQD Apply topically as needed.      . fish oil-omega-3 fatty acids 1000 MG capsule Take 1 g by mouth continuous as needed.      . folic acid (FOLVITE) 400 MCG tablet Take 400 mcg by mouth continuous as needed.      . Grapefruit Seed 60 % EXTR by Does not apply route continuous as needed.      . Iron-Vitamins (GERITOL PO) Take by mouth continuous as needed.      . lactobacillus acidophilus (BACID) TABS Take 1 tablet by mouth continuous as needed.      . metFORMIN (GLUCOPHAGE) 1000 MG tablet Take 1 tablet (1,000 mg total) by mouth 2 (two) times daily with a meal.  60 tablet  11  . MOLYBDENUM PO  Take by mouth continuous as needed.      . Multiple Vitamin (MULTIVITAMIN) tablet Take 1 tablet by mouth daily.        Pennie Banter Leaves POWD by Does not apply route continuous as needed.      . vitamin B-12 (CYANOCOBALAMIN) 50 MCG tablet Take 50 mcg by mouth once as needed.           Review of Systems See HPI    Objective:   Physical Exam Physical Exam  Nursing note and vitals reviewed.  Constitutional: She is oriented to person, place, and time. She appears well-developed and well-nourished.  HENT:  Head: Normocephalic and atraumatic.  Cardiovascular: Normal rate and regular rhythm. Exam reveals no gallop and no friction rub.  No murmur heard.  Pulmonary/Chest: Breath sounds normal. She has no wheezes. She has no rales.  ABD:  Large soft NT/ND  Neg Murphy's sign.  BS pos.  No rebound or peritoneal signs Neurological: She is alert and oriented to person, place, and time.  Skin: Skin is warm and dry.   Nummular shaped reddened area at prior injection site.  She has maculopapular rash flexoral surface of both elbows and lower abd. Psychiatric: She has a normal mood and affect. Her behavior is normal.             Assessment & Plan:  Elevated alk phos  Will recheck today.  If still elevated will consider ultrasound.  Further management based on results  Eczema:  OK for HC creme 2.5%

## 2012-11-16 NOTE — Telephone Encounter (Signed)
Notified of normal office hours

## 2012-11-16 NOTE — Patient Instructions (Addendum)
Take creme as prescribed

## 2012-11-17 ENCOUNTER — Telehealth: Payer: Self-pay | Admitting: *Deleted

## 2012-11-17 NOTE — Telephone Encounter (Signed)
Called pt with results of labs left message

## 2012-11-17 NOTE — Telephone Encounter (Signed)
Message copied by Mathews Robinsons on Wed Nov 17, 2012  3:26 PM ------      Message from: Raechel Chute D      Created: Wed Nov 17, 2012 12:52 PM       Karen Kitchens            Call pt and let her know that all liver tests are normal  OK to mail labs to her

## 2012-12-01 IMAGING — US US TRANSVAGINAL NON-OB
1 series · 13 of 25 positions shown · non-contrast
Comparison: None.

CLINICAL DATA: History of left lower quadrant pelvic pain.  History
of Clomid therapy.  Obesity.  LMP 10/08/2011.

TRANSABDOMINAL AND TRANSVAGINAL ULTRASOUND OF PELVIS
TECHNIQUE: Both transabdominal and transvaginal ultrasound
examinations of the pelvis were performed. Transabdominal technique
was performed for global imaging of the pelvis including uterus,
ovaries, adnexal regions, and pelvic cul-de-sac.

[Series 1: us transvaginal non-ob · 0.35mm/px · 13 of 57 slices shown]
[im 1/57]
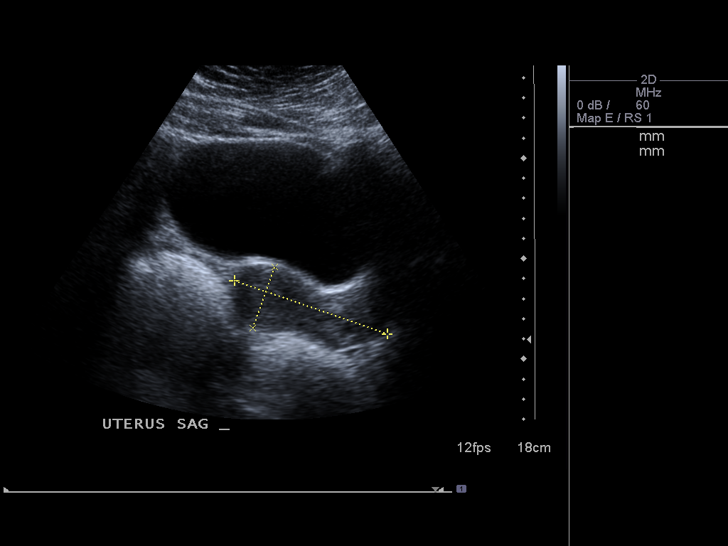
[im 5/57]
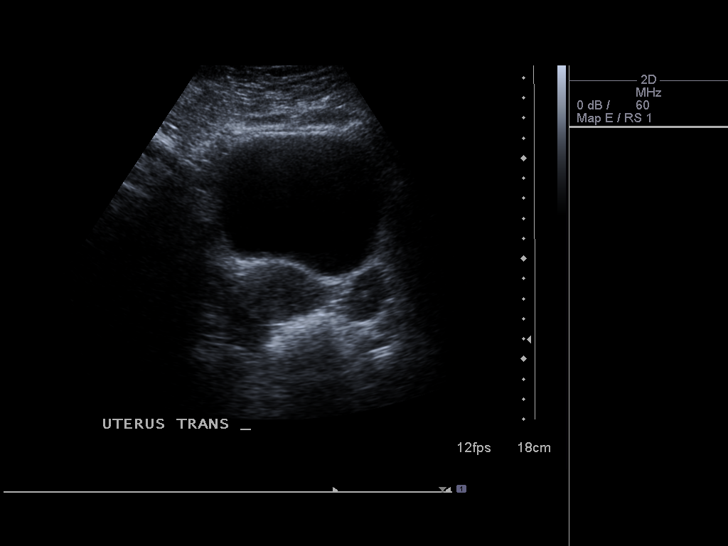
[im 10/57]
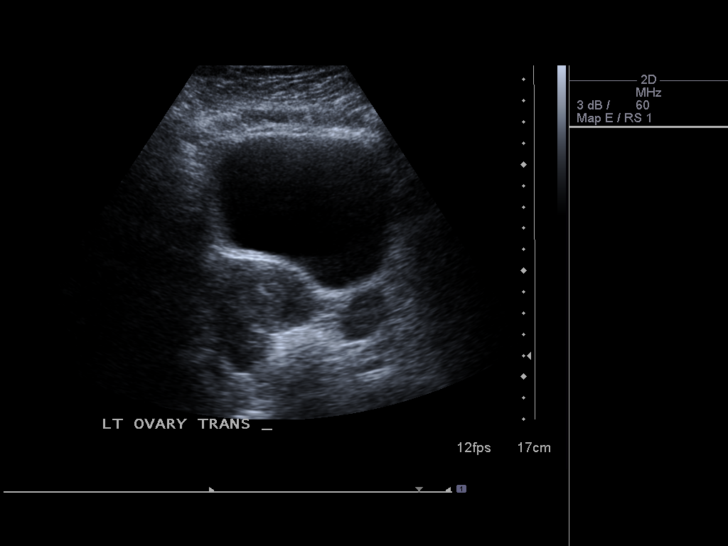
[im 15/57]
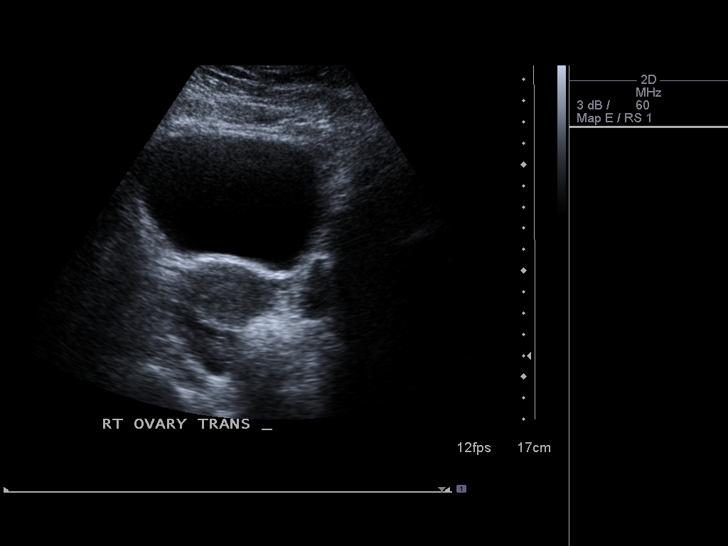
[im 19/57]
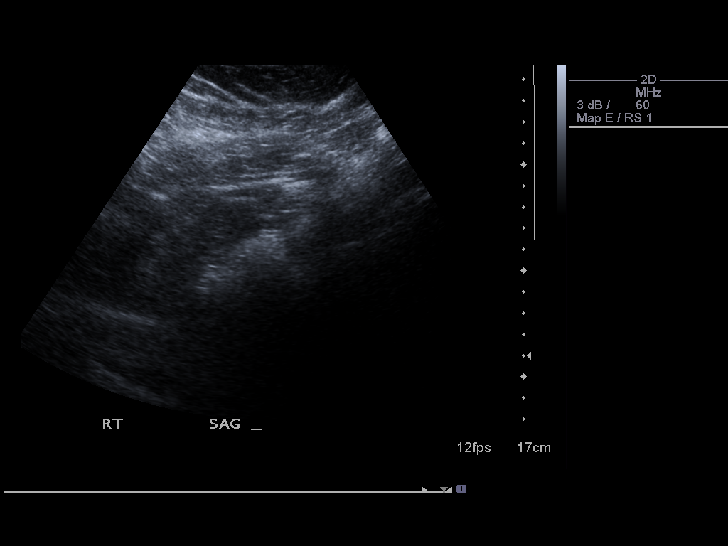
[im 24/57]
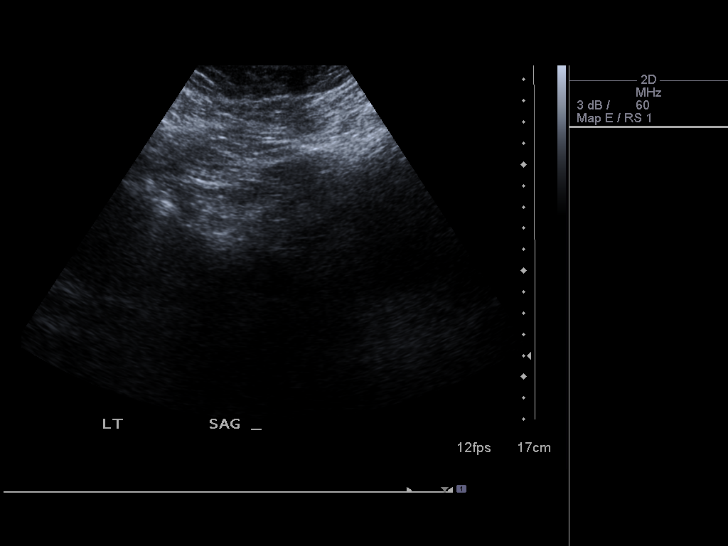
[im 29/57]
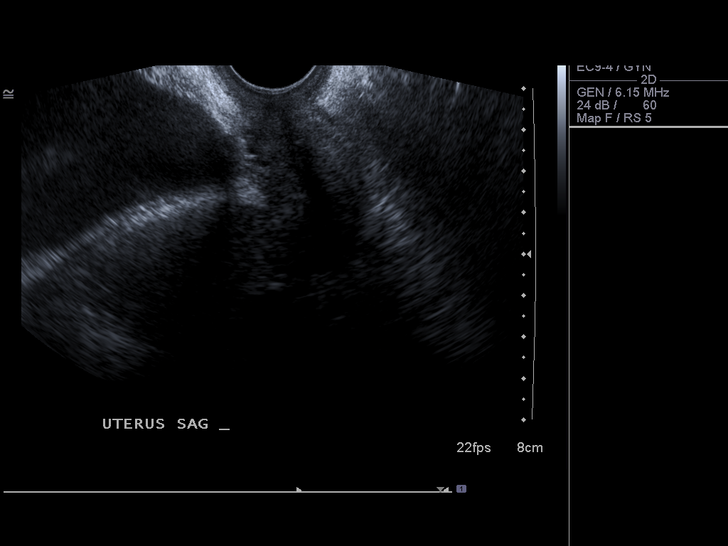
[im 33/57]
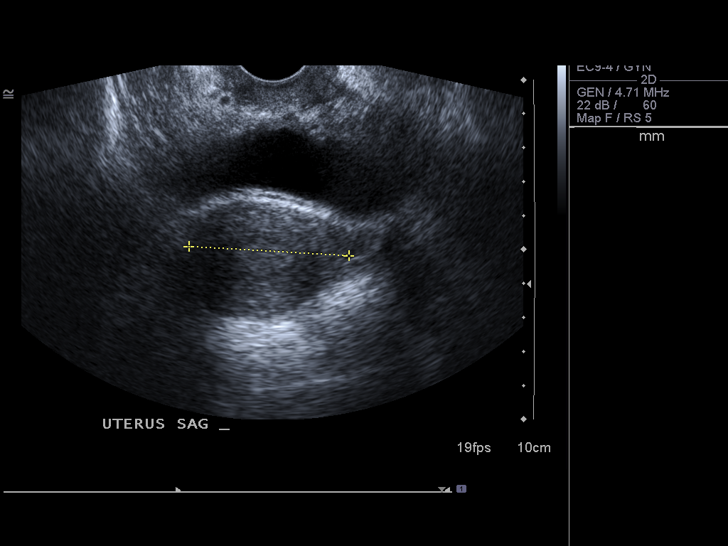
[im 38/57]
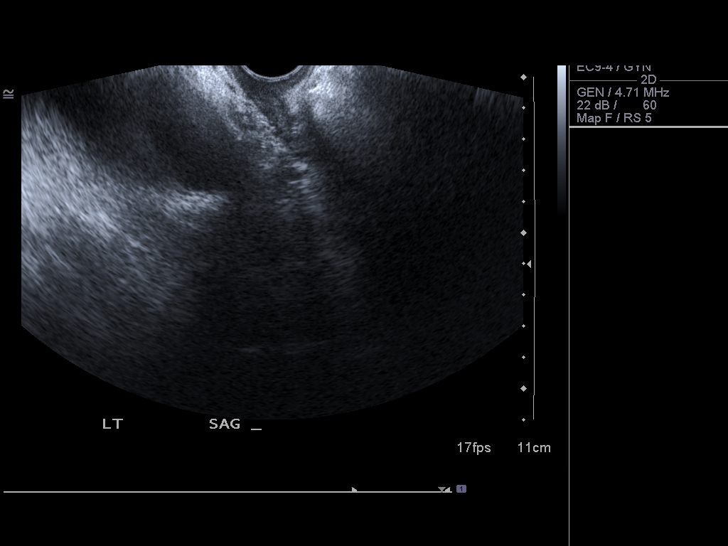
[im 43/57]
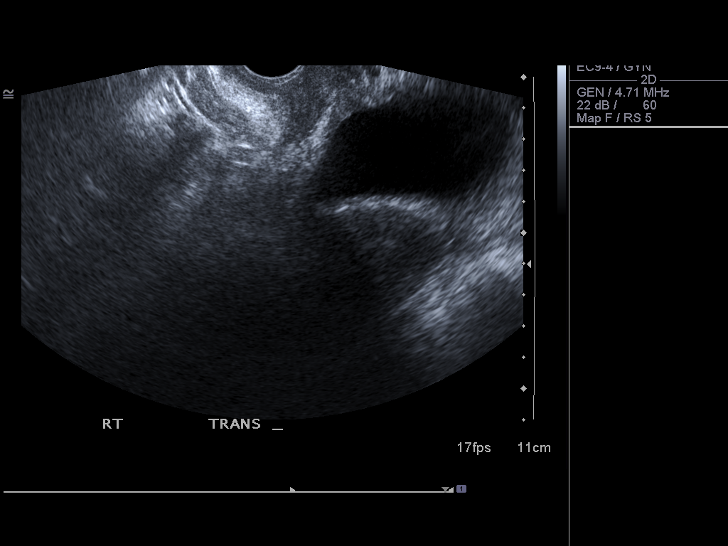
[im 47/57]
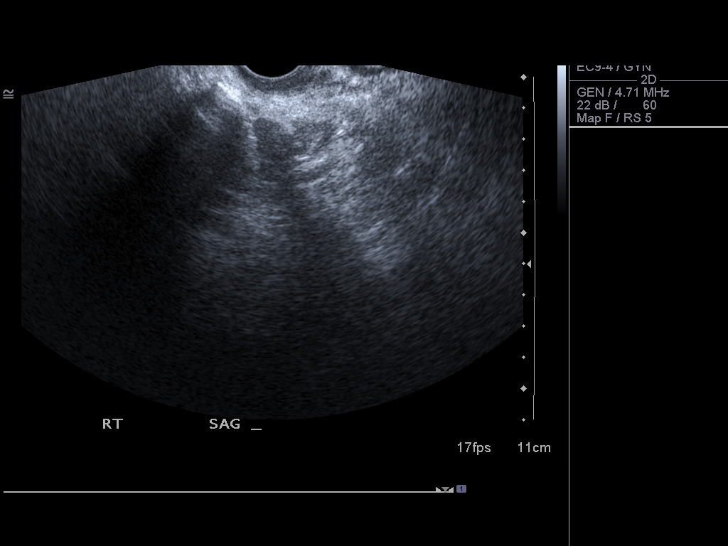
[im 52/57]
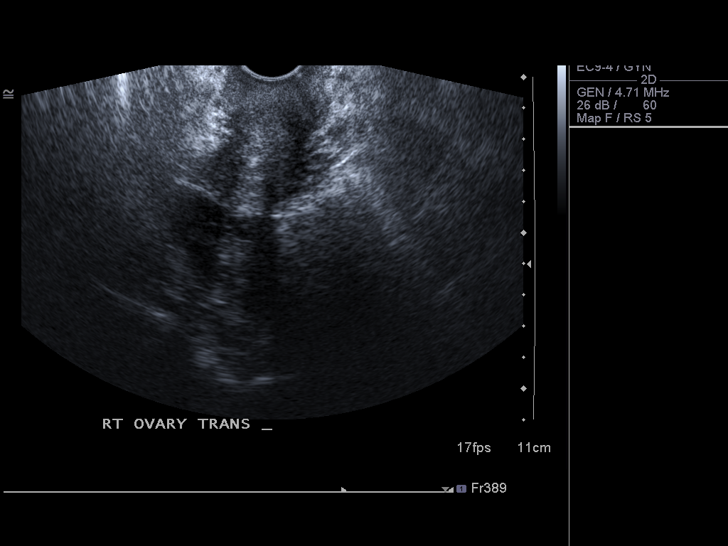
[im 57/57]
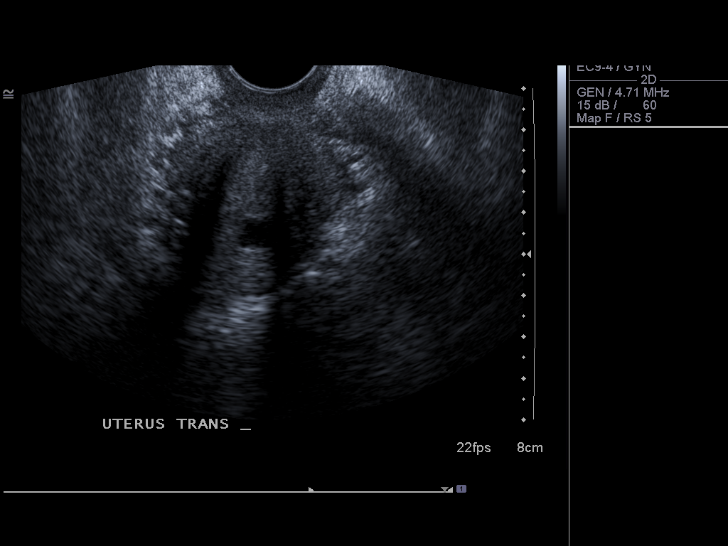

[13 of 25 positions shown; findings below may reference images not displayed]

It was necessary to proceed with endovaginal exam following the
transabdominal exam to visualize the details of the parenchyma of
the myometrium, endometrium, and ovaries and to further evaluate
adnexal regions.
FINDINGS: Uterus: Uterus measured 8.1 x 3.2 x 4.1 cm.  No myometrial
abnormality was evident.

Endometrium: Endometrial thickness measured 810 mm.  No endometrial
mass or fluid collection is evident.

Right ovary:  Right ovary measured 3.1 x 2.5 x 2.2 cm.  No right
ovarian or adnexal lesion was evident.

Left ovary: Left ovary measured 3 x 1.9 x 2.1 cm.  No left ovarian
or left adnexal lesion was evident.

Other findings: No free fluid was seen within the pelvis.  No
hydrosalpinx is evident.
IMPRESSION: Normal study. No evidence of pelvic mass or other significant
abnormality.

## 2012-12-01 IMAGING — US US RENAL
1 series · 14 of 25 positions shown · non-contrast
Comparison: None.

CLINICAL DATA: Hematuria.

RENAL/URINARY TRACT ULTRASOUND COMPLETE

[Series 1: us renal · 0.32mm/px · 14 of 26 slices shown]
[im 1/26]
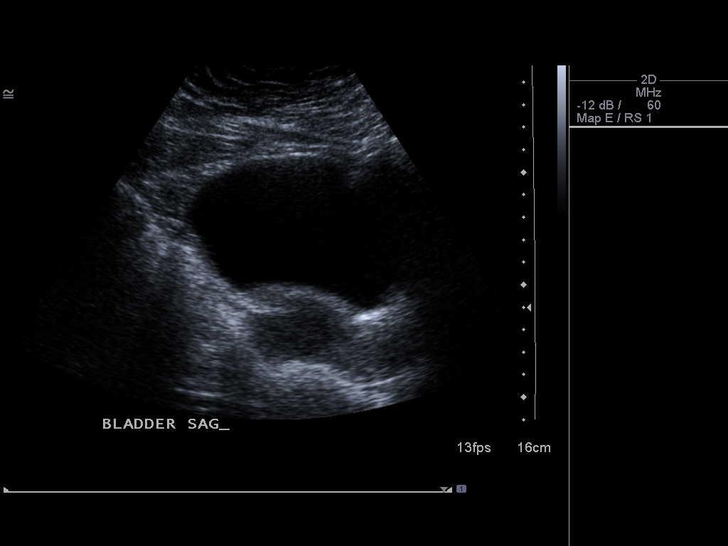
[im 3/26]
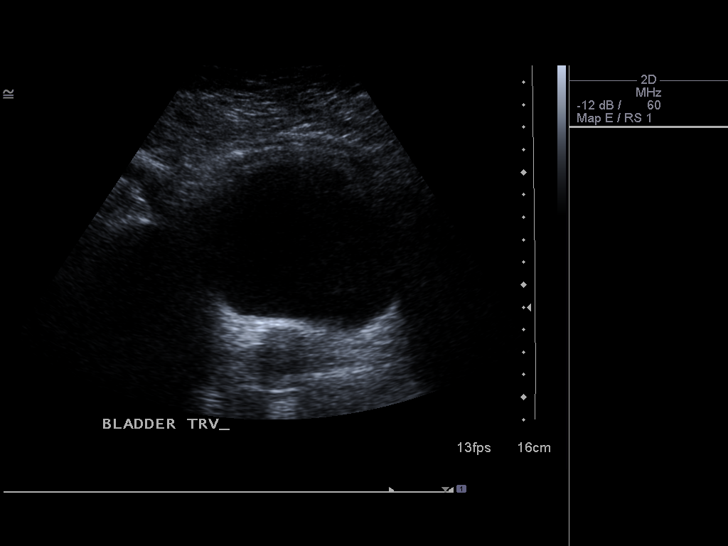
[im 5/26]
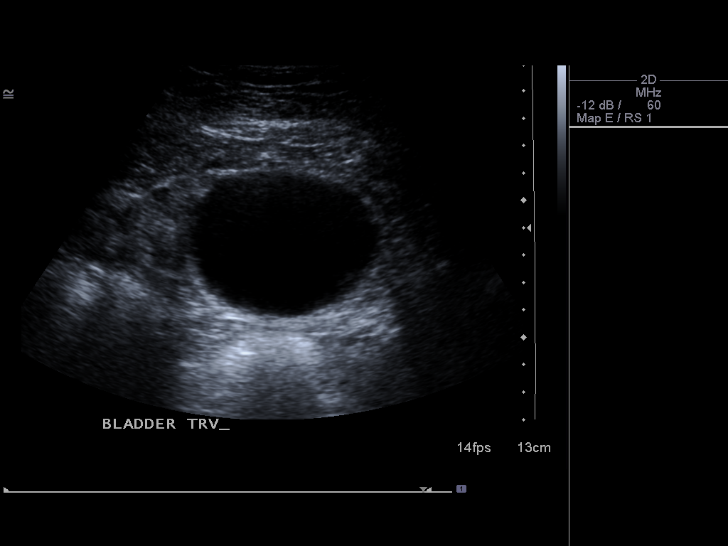
[im 7/26]
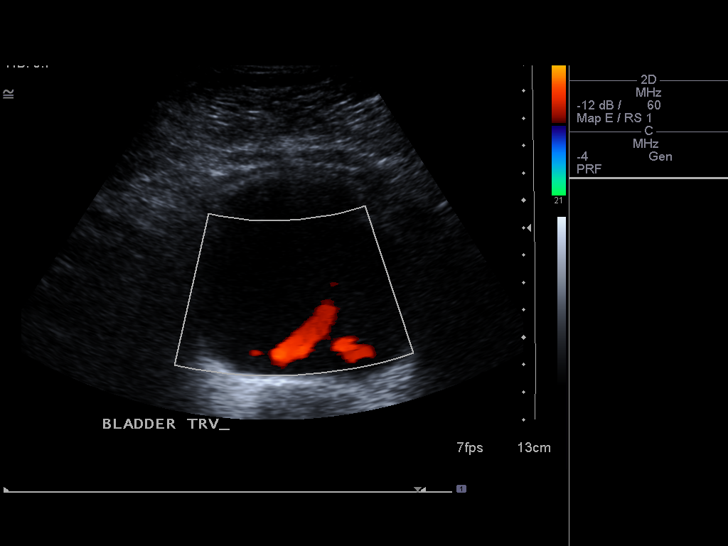
[im 9/26]
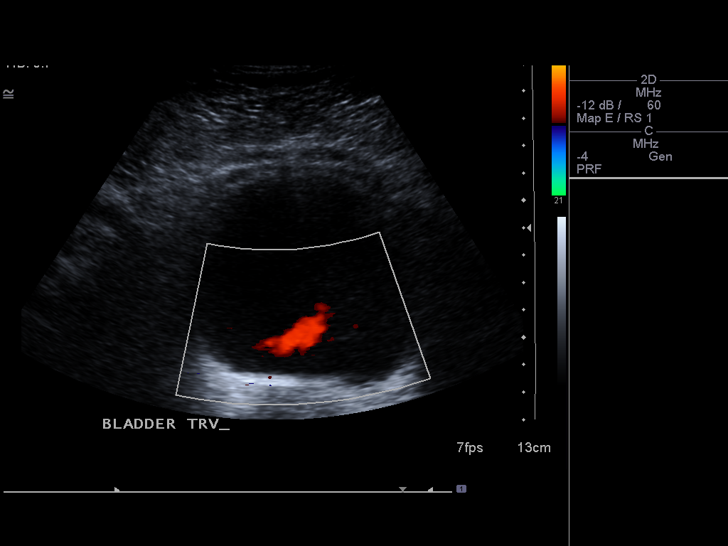
[im 10/26]
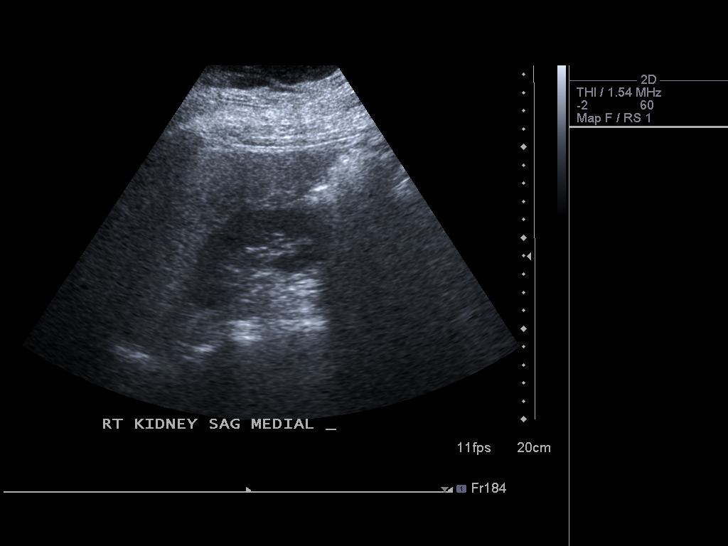
[im 12/26]
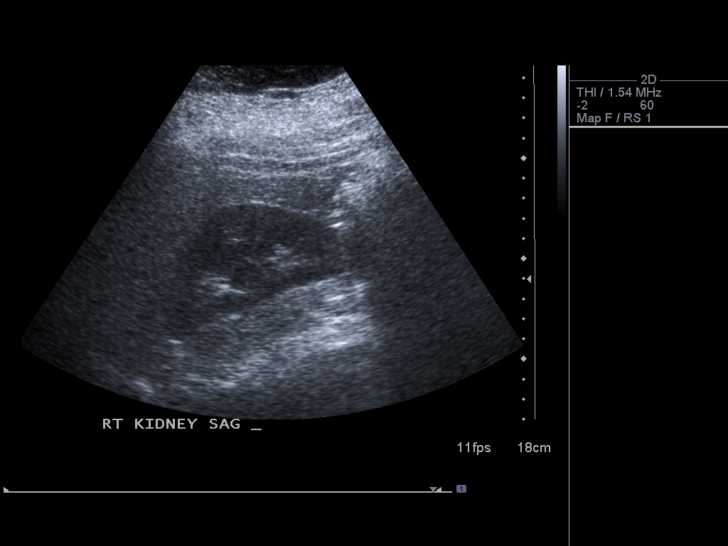
[im 14/26]
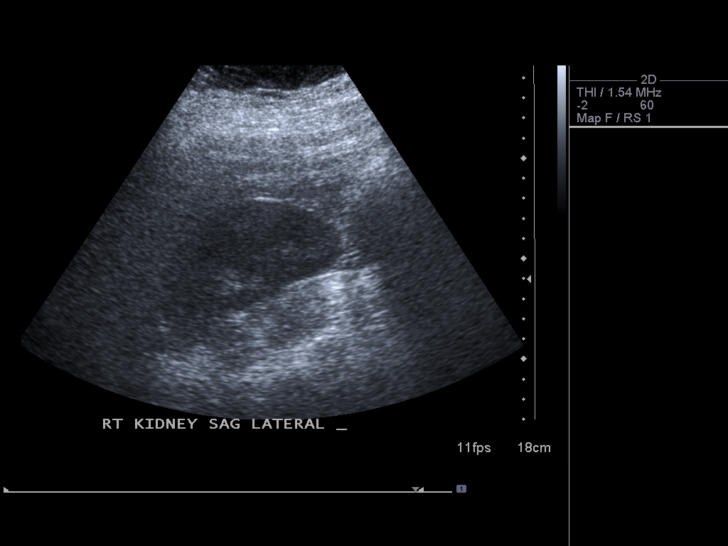
[im 16/26]
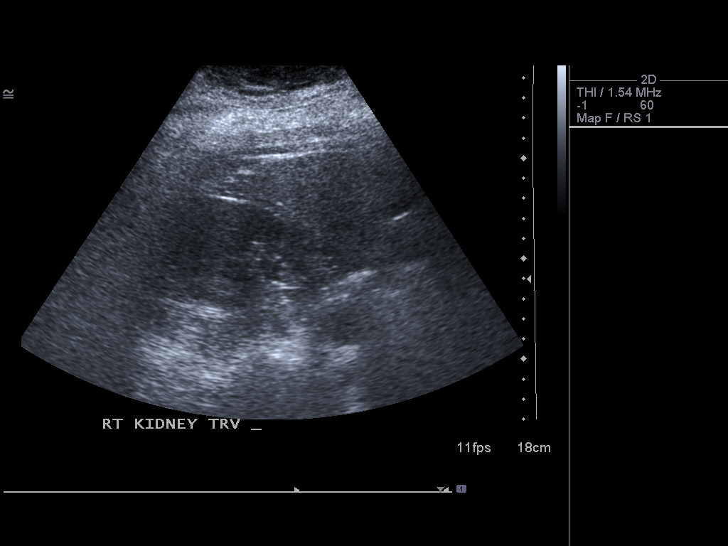
[im 17/26]
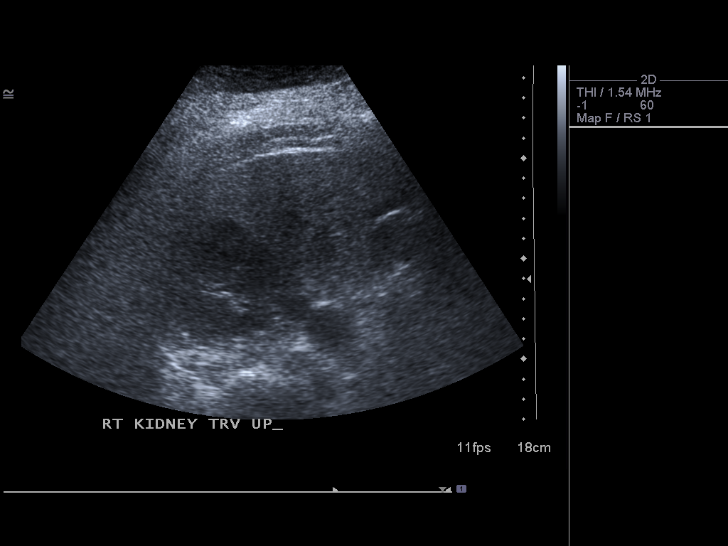
[im 19/26]
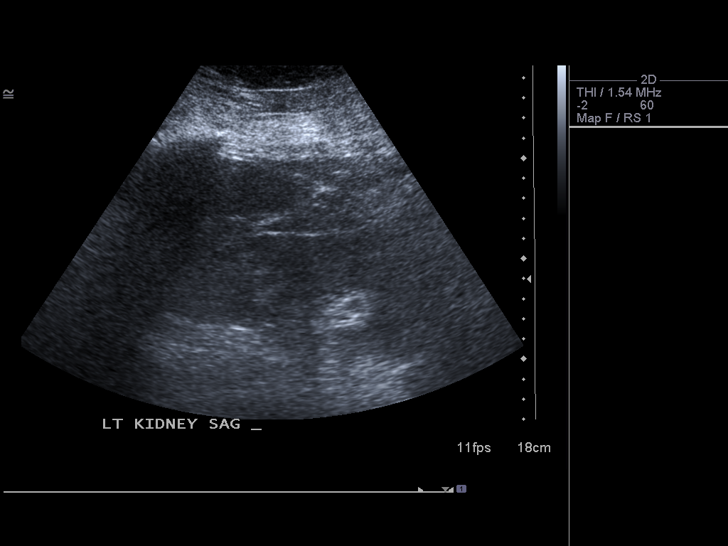
[im 21/26]
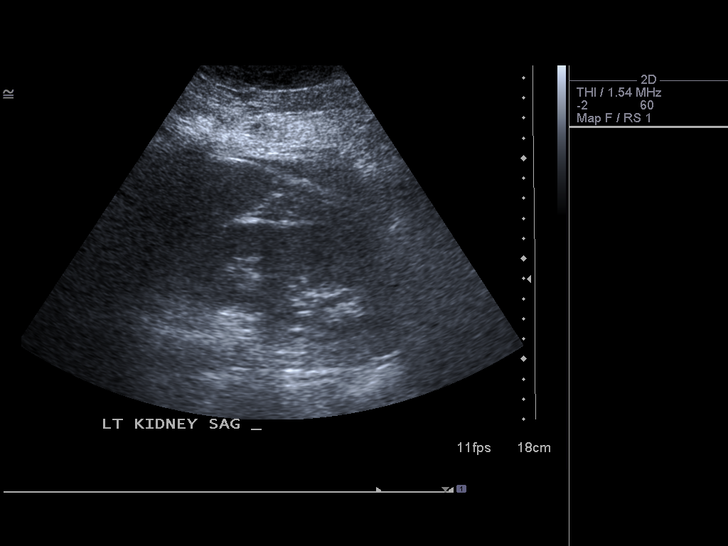
[im 23/26]
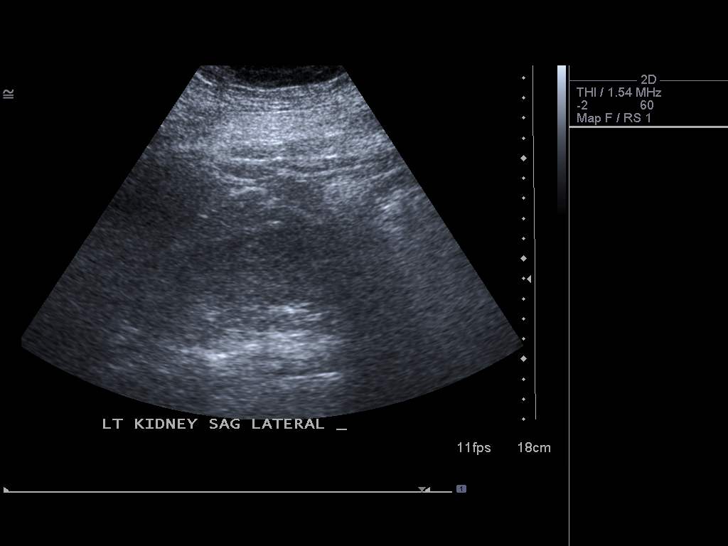
[im 26/26]
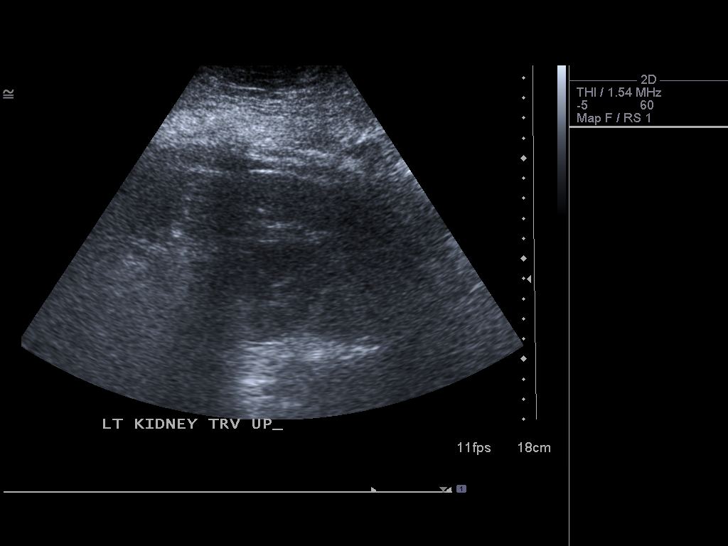

[14 of 25 positions shown; findings below may reference images not displayed]

FINDINGS: Right Kidney:  10.5 cm. Normal size and echotexture.  No focal
abnormality.  No hydronephrosis.

Left Kidney:  10.4 cm. Normal size and echotexture.  No focal
abnormality.  No hydronephrosis.

Bladder:  Bilateral ureteral jets noted in the bladder.  Bladder
wall is normal.
IMPRESSION: Normal study.

## 2013-01-31 ENCOUNTER — Ambulatory Visit (INDEPENDENT_AMBULATORY_CARE_PROVIDER_SITE_OTHER): Payer: BC Managed Care – PPO | Admitting: Internal Medicine

## 2013-01-31 ENCOUNTER — Encounter: Payer: Self-pay | Admitting: Internal Medicine

## 2013-01-31 VITALS — BP 118/74 | HR 71 | Temp 98.2°F | Resp 18 | Ht 66.0 in | Wt 283.1 lb

## 2013-01-31 DIAGNOSIS — M25519 Pain in unspecified shoulder: Secondary | ICD-10-CM

## 2013-01-31 DIAGNOSIS — R21 Rash and other nonspecific skin eruption: Secondary | ICD-10-CM | POA: Insufficient documentation

## 2013-01-31 MED ORDER — NYSTATIN-TRIAMCINOLONE 100000-0.1 UNIT/GM-% EX OINT: TOPICAL_OINTMENT | Freq: Two times a day (BID) | CUTANEOUS | Status: DC

## 2013-01-31 MED ORDER — NABUMETONE 500 MG PO TABS: 500.0000 mg | ORAL_TABLET | Freq: Two times a day (BID) | ORAL | Status: DC

## 2013-01-31 NOTE — Progress Notes (Signed)
Subjective:    Patient ID: Sabrina Avery, female    DOB: [birth year], 37 y.o.   MRN: [id number]  HPI  Sabrina Avery is here for acute visit.  She reports 1 week of R shoulder pain.  Denies injury or trauma.  She reports she had xrays and was told "nothing was broken"  I do not have copy of films.  Similar episode one year ago.  Also has itchy rash lower back .  [name] for this in the past No Known Allergies Past Medical History  Diagnosis Date  . Insulin resistance   . Sleep apnea   . Lumbar disc herniation     per pt report  . Polycystic ovarian syndrome   . Sleep apnea syndrome   . Anemia   . Infertility    History reviewed. No pertinent past surgical history. History   Social History  . Marital Status: Married    Spouse Name: N/A    Number of Children: N/A  . Years of Education: N/A   Occupational History  . Not on file.   Social History Main Topics  . Smoking status: Never Smoker   . Smokeless tobacco: Never Used  . Alcohol Use: No     Comment: occasionally wine  . Drug Use: No  . Sexually Active: Yes    Birth Control/ Protection: None   Other Topics Concern  . Not on file   Social History Narrative  . No narrative on file   Family History  Problem Relation Age of Onset  . Hyperlipidemia Mother   . Thyroid disease Mother   . Diabetes Father   . Hypertension Brother   . Diabetes Paternal Grandmother   . Heart failure Paternal Grandmother    Patient Active Problem List  Diagnosis  . Sleep apnea  . Lumbar disc herniation  . Polycystic ovarian syndrome  . Morbid obesity  . Infertility, female  . Intertrigo  . Knee pain  . Elevated uric acid in blood  . Elevated alkaline phosphatase measurement  . Eczema   Current Outpatient Prescriptions on File Prior to Visit  Medication Sig Dispense Refill  . Prenatal Vit-Fe Fumarate-FA (PRENATAL MULTIVITAMIN) TABS Take 1 tablet by mouth continuous as needed.      . calcium-vitamin D (OSCAL WITH  D) 500-200 MG-UNIT per tablet Take 1 tablet by mouth continuous as needed.      . Capsicum, Cayenne, (CAYENNE PEPPER) 450 MG CAPS Take by mouth continuous as needed.      . clomiPHENE (CLOMID) 50 MG tablet 2 tablets daily starting 07/31/12 x 5 days  10 tablet  0  . collodion LIQD Apply topically as needed.      . fish oil-omega-3 fatty acids 1000 MG capsule Take 1 g by mouth continuous as needed.      . folic acid (FOLVITE) 400 MCG tablet Take 400 mcg by mouth continuous as needed.      . Grapefruit Seed 60 % EXTR by Does not apply route continuous as needed.      . hydrocortisone 2.5 % cream Apply topically 2 (two) times daily.  30 g  0  . Iron-Vitamins (GERITOL PO) Take by mouth continuous as needed.      . lactobacillus acidophilus (BACID) TABS Take 1 tablet by mouth continuous as needed.      . metFORMIN (GLUCOPHAGE) 1000 MG tablet Take 1 tablet (1,000 mg total) by mouth 2 (two) times daily with a meal.  60 tablet  11  .  MOLYBDENUM PO Take by mouth continuous as needed.      . Multiple Vitamin (MULTIVITAMIN) tablet Take 1 tablet by mouth daily.        Pennie Banter Leaves POWD by Does not apply route continuous as needed.      . progesterone (PROMETRIUM) 200 MG capsule Take 2 tablets by mouth Daily. For first 10 days of cycle      . vitamin B-12 (CYANOCOBALAMIN) 50 MCG tablet Take 50 mcg by mouth once as needed.       No current facility-administered medications on file prior to visit.       Review of Systems See HPI    Objective:   Physical Exam Physical Exam  Nursing note and vitals reviewed.  Constitutional: She is oriented to person, place, and time. She appears well-developed and well-nourished.  HENT:  Head: Normocephalic and atraumatic.  Cardiovascular: Normal rate and regular rhythm. Exam reveals no gallop and no friction rub.  No murmur heard.  Pulmonary/Chest: Breath sounds normal. She has no wheezes. She has no rales.  M/S  Good ROM R shoulder No pain I/E  roatation Neurological: She is alert and oriented to person, place, and time.  Skin: Skin is warm and dry.  Psychiatric: She has a normal mood and affect. Her behavior is normal.              Assessment & Plan:  R shoulder pain  Impingement vs rotator cuff.  Pt declines Xrays.  Will give Relafen 500 mg bid for next 2 weeks.  Will refer to Dr. Pearletha Forge  Nonspecific dermatitis  Empiric Mycolog BId.  Advised to go back to see her dermatologist as she may need biopsy.  I also gave her office number for Dr. Fredric Dine

## 2013-01-31 NOTE — Patient Instructions (Addendum)
To see your dermatologist for skin rash  See me as needed

## 2013-02-21 ENCOUNTER — Ambulatory Visit: Payer: BC Managed Care – PPO | Admitting: Family Medicine

## 2013-04-28 ENCOUNTER — Telehealth: Payer: Self-pay | Admitting: *Deleted

## 2013-04-28 NOTE — Telephone Encounter (Signed)
Note to front desk

## 2013-04-28 NOTE — Telephone Encounter (Signed)
Pt needs a letter stating that she is healthy enough to participate in a weight loss program through her work

## 2013-05-11 ENCOUNTER — Ambulatory Visit (HOSPITAL_BASED_OUTPATIENT_CLINIC_OR_DEPARTMENT_OTHER)
Admission: RE | Admit: 2013-05-11 | Discharge: 2013-05-11 | Disposition: A | Discharge status: Home / Self Care | Payer: BC Managed Care – PPO | Source: Ambulatory Visit | Attending: Internal Medicine | Admitting: Internal Medicine

## 2013-05-11 ENCOUNTER — Ambulatory Visit (INDEPENDENT_AMBULATORY_CARE_PROVIDER_SITE_OTHER): Payer: BC Managed Care – PPO | Admitting: Internal Medicine

## 2013-05-11 ENCOUNTER — Encounter: Payer: Self-pay | Admitting: Internal Medicine

## 2013-05-11 VITALS — BP 116/75 | HR 97 | Temp 98.8°F | Resp 18 | Wt 277.0 lb

## 2013-05-11 DIAGNOSIS — R509 Fever, unspecified: Secondary | ICD-10-CM

## 2013-05-11 DIAGNOSIS — R059 Cough, unspecified: Secondary | ICD-10-CM

## 2013-05-11 DIAGNOSIS — R05 Cough: Secondary | ICD-10-CM

## 2013-05-11 DIAGNOSIS — R058 Other specified cough: Secondary | ICD-10-CM

## 2013-05-11 LAB — POCT URINALYSIS DIPSTICK
Ketones, UA: NEGATIVE
Leukocytes, UA: NEGATIVE
Nitrite, UA: NEGATIVE
Protein, UA: NEGATIVE
pH, UA: 8

## 2013-05-11 MED ORDER — HYDROCOD POLST-CHLORPHEN POLST 10-8 MG/5ML PO LQCR: 5.0000 mL | Freq: Two times a day (BID) | ORAL | Status: DC | PRN

## 2013-05-11 MED ORDER — AZITHROMYCIN 250 MG PO TABS: ORAL_TABLET | ORAL | Status: DC

## 2013-05-11 NOTE — Addendum Note (Signed)
Addended by: Mathews Robinsons on: 05/11/2013 03:25 PM   Modules accepted: Orders

## 2013-05-11 NOTE — Progress Notes (Signed)
Subjective:    Patient ID: Sabrina Avery, female    DOB: 1976/09/10, 37 y.o.   MRN: [id number]  HPI  [name] is here for acute visit.  Two weeks of nasal congestion, ear pain.  Now has lower resp symptoms and cough productive of green sputum.  T 99.2 at home.   Some pain in posterior chest wall.    CXR neg infiltrate or effusion  No Known Allergies Past Medical History  Diagnosis Date  . Insulin resistance   . Sleep apnea   . Lumbar disc herniation     per pt report  . Polycystic ovarian syndrome   . Sleep apnea syndrome   . Anemia   . Infertility    No past surgical history on file. History   Social History  . Marital Status: Married    Spouse Name: N/A    Number of Children: N/A  . Years of Education: N/A   Occupational History  . Not on file.   Social History Main Topics  . Smoking status: Never Smoker   . Smokeless tobacco: Never Used  . Alcohol Use: No     Comment: occasionally wine  . Drug Use: No  . Sexually Active: Yes    Birth Control/ Protection: None   Other Topics Concern  . Not on file   Social History Narrative  . No narrative on file   Family History  Problem Relation Age of Onset  . Hyperlipidemia Mother   . Thyroid disease Mother   . Diabetes Father   . Hypertension Brother   . Diabetes Paternal Grandmother   . Heart failure Paternal Grandmother    Patient Active Problem List   Diagnosis Date Noted  . Pain in joint, shoulder region 01/31/2013  . Rash and nonspecific skin eruption 01/31/2013  . Eczema 11/16/2012  . Elevated alkaline phosphatase measurement 11/07/2012  . Elevated uric acid in blood 10/19/2012  . Knee pain 04/22/2012  . Intertrigo 01/21/2012  . Infertility, female 11/20/2011  . Morbid obesity 11/19/2011  . Sleep apnea   . Lumbar disc herniation   . Polycystic ovarian syndrome    Current Outpatient Prescriptions on File Prior to Visit  Medication Sig Dispense Refill  . progesterone (PROMETRIUM) 200 MG capsule Take 2  tablets by mouth Daily. For first 10 days of cycle      . calcium-vitamin D (OSCAL WITH D) 500-200 MG-UNIT per tablet Take 1 tablet by mouth continuous as needed.      . Capsicum, Cayenne, (CAYENNE PEPPER) 450 MG CAPS Take by mouth continuous as needed.      . clomiPHENE (CLOMID) 50 MG tablet 2 tablets daily starting 07/31/12 x 5 days  10 tablet  0  . collodion LIQD Apply topically as needed.      . fish oil-omega-3 fatty acids 1000 MG capsule Take 1 g by mouth continuous as needed.      . folic acid (FOLVITE) 400 MCG tablet Take 400 mcg by mouth continuous as needed.      . Grapefruit Seed 60 % EXTR by Does not apply route continuous as needed.      . hydrocortisone 2.5 % cream Apply topically 2 (two) times daily.  30 g  0  . Iron-Vitamins (GERITOL PO) Take by mouth continuous as needed.      . lactobacillus acidophilus (BACID) TABS Take 1 tablet by mouth continuous as needed.      . metFORMIN (GLUCOPHAGE) 1000 MG tablet Take 1 tablet (1,000 mg total) by  mouth 2 (two) times daily with a meal.  60 tablet  11  . MOLYBDENUM PO Take by mouth continuous as needed.      . Multiple Vitamin (MULTIVITAMIN) tablet Take 1 tablet by mouth daily.        . nabumetone (RELAFEN) 500 MG tablet Take 1 tablet (500 mg total) by mouth 2 (two) times daily.  30 tablet  0  . nystatin-triamcinolone ointment (MYCOLOG) Apply topically 2 (two) times daily.  30 g  0  . Parsley Leaves POWD by Does not apply route continuous as needed.      . Prenatal Vit-Fe Fumarate-FA (PRENATAL MULTIVITAMIN) TABS Take 1 tablet by mouth continuous as needed.      . vitamin B-12 (CYANOCOBALAMIN) 50 MCG tablet Take 50 mcg by mouth once as needed.       No current facility-administered medications on file prior to visit.      Review of Systems    see HPI  Objective:   Physical Exam Physical Exam  Nursing note and vitals reviewed.  Constitutional: She is oriented to person, place, and time. She appears well-developed and  well-nourished. She is cooperative.  HENT:  Head: Normocephalic and atraumatic.  Nose: Mucosal edema present.  Eyes: Conjunctivae and EOM are normal. Pupils are equal, round, and reactive to light.  Neck: Neck supple.  Cardiovascular: Regular rhythm, normal heart sounds, intact distal pulses and normal pulses. Exam reveals no gallop and no friction rub.  No murmur heard.  Pulmonary/Chest: She has no wheezes. She has rhonchi. She has no rales.  Neurological: She is alert and oriented to person, place, and time.  Skin: Skin is warm and dry. No abrasion, no bruising, no ecchymosis and no rash noted. No cyanosis. Nails show no clubbing.  Psychiatric: She has a normal mood and affect. Her speech is normal and behavior is normal.            Assessment & Plan:  Bronchitis  Will give Z-pak   Pleuritic chest pain CXR neg for infiltrate   Cough:  Ok for Tussionex    See me next week if not better

## 2013-05-11 NOTE — Patient Instructions (Addendum)
See me next week 

## 2013-05-18 ENCOUNTER — Ambulatory Visit: Payer: BC Managed Care – PPO | Admitting: Internal Medicine

## 2013-07-21 ENCOUNTER — Ambulatory Visit (INDEPENDENT_AMBULATORY_CARE_PROVIDER_SITE_OTHER): Payer: BC Managed Care – PPO | Admitting: Internal Medicine

## 2013-07-21 DIAGNOSIS — R21 Rash and other nonspecific skin eruption: Secondary | ICD-10-CM

## 2013-07-21 DIAGNOSIS — R002 Palpitations: Secondary | ICD-10-CM

## 2013-07-21 MED ORDER — NYSTATIN-TRIAMCINOLONE 100000-0.1 UNIT/GM-% EX OINT: TOPICAL_OINTMENT | Freq: Two times a day (BID) | CUTANEOUS | Status: DC

## 2013-07-21 NOTE — Progress Notes (Signed)
Subjective:    Patient ID: Sabrina Avery, female    DOB: 11-18-1976, 37 y.o.   MRN: [id number]  HPI Sabrina Avery is here for acute visit.    She noted her heart beating rapidly several days ago.  She does note that she has increased her caffiene intake and that is when her symptoms happpen .  She denies dizziness or frank syncope   No chest pain Or SOB  She also notes chronic rash on her leg .  She says she is having lots of itching in her anal area.  She has seen a dermatollogist about this.  She states they have not done any biopsies  No Known Allergies Past Medical History  Diagnosis Date  . Insulin resistance   . Sleep apnea   . Lumbar disc herniation     per pt report  . Polycystic ovarian syndrome   . Sleep apnea syndrome   . Anemia   . Infertility    No past surgical history on file. History   Social History  . Marital Status: Married    Spouse Name: N/A    Number of Children: N/A  . Years of Education: N/A   Occupational History  . Not on file.   Social History Main Topics  . Smoking status: Never Smoker   . Smokeless tobacco: Never Used  . Alcohol Use: No     Comment: occasionally wine  . Drug Use: No  . Sexually Active: Yes    Birth Control/ Protection: None   Other Topics Concern  . Not on file   Social History Narrative  . No narrative on file   Family History  Problem Relation Age of Onset  . Hyperlipidemia Mother   . Thyroid disease Mother   . Diabetes Father   . Hypertension Brother   . Diabetes Paternal Grandmother   . Heart failure Paternal Grandmother    Patient Active Problem List   Diagnosis Date Noted  . Pain in joint, shoulder region 01/31/2013  . Rash and nonspecific skin eruption 01/31/2013  . Eczema 11/16/2012  . Elevated alkaline phosphatase measurement 11/07/2012  . Elevated uric acid in blood 10/19/2012  . Knee pain 04/22/2012  . Intertrigo 01/21/2012  . Infertility, female 11/20/2011  . Morbid obesity 11/19/2011  . Sleep  apnea   . Lumbar disc herniation   . Polycystic ovarian syndrome    Current Outpatient Prescriptions on File Prior to Visit  Medication Sig Dispense Refill  . azithromycin (ZITHROMAX) 250 MG tablet Take as directed  6 tablet  0  . calcium-vitamin D (OSCAL WITH D) 500-200 MG-UNIT per tablet Take 1 tablet by mouth continuous as needed.      . Capsicum, Cayenne, (CAYENNE PEPPER) 450 MG CAPS Take by mouth continuous as needed.      . chlorpheniramine-HYDROcodone (TUSSIONEX PENNKINETIC ER) 10-8 MG/5ML LQCR Take 5 mLs by mouth every 12 (twelve) hours as needed.  140 mL  0  . clomiPHENE (CLOMID) 50 MG tablet 2 tablets daily starting 07/31/12 x 5 days  10 tablet  0  . collodion LIQD Apply topically as needed.      . fish oil-omega-3 fatty acids 1000 MG capsule Take 1 g by mouth continuous as needed.      . folic acid (FOLVITE) 400 MCG tablet Take 400 mcg by mouth continuous as needed.      . Grapefruit Seed 60 % EXTR by Does not apply route continuous as needed.      . hydrocortisone 2.5 %  cream Apply topically 2 (two) times daily.  30 g  0  . Iron-Vitamins (GERITOL PO) Take by mouth continuous as needed.      . lactobacillus acidophilus (BACID) TABS Take 1 tablet by mouth continuous as needed.      Marland Kitchen MOLYBDENUM PO Take by mouth continuous as needed.      . Multiple Vitamin (MULTIVITAMIN) tablet Take 1 tablet by mouth daily.        . nabumetone (RELAFEN) 500 MG tablet Take 1 tablet (500 mg total) by mouth 2 (two) times daily.  30 tablet  0  . Parsley Leaves POWD by Does not apply route continuous as needed.      . Prenatal Vit-Fe Fumarate-FA (PRENATAL MULTIVITAMIN) TABS Take 1 tablet by mouth continuous as needed.      . progesterone (PROMETRIUM) 200 MG capsule Take 2 tablets by mouth Daily. For first 10 days of cycle      . vitamin B-12 (CYANOCOBALAMIN) 50 MCG tablet Take 50 mcg by mouth once as needed.       No current facility-administered medications on file prior to visit.      Review of  Systems See HPI    Objective:   Physical Exam Physical Exam  Nursing note and vitals reviewed.  Constitutional: She is oriented to person, place, and time. She appears well-developed and well-nourished.  HENT:  Head: Normocephalic and atraumatic.  Cardiovascular: Normal rate and regular rhythm. Exam reveals no gallop and no friction rub.  No murmur heard.  Pulmonary/Chest: Breath sounds normal. She has no wheezes. She has no rales.  REctal  Visual exam  She has reddened area with satellite lesions peri-rectally Leg  Maculopapular rash L lateral calf Neurological: She is alert and oriented to person, place, and time.  Skin: Skin is warm and dry.  Psychiatric: She has a normal mood and affect. Her behavior is normal.       Assessment & Plan:  Palpitations  EKG today NSR normal rate no acute changes.  ADvised if dizziness chest painor loss of  consciousness to contact our office  Skin rash  Peri-rectal area may be fungal  Will give Mycolog creme.  I advised pt she needs to follow up with her dermatologist for her rash.  [name] in [place] but cannot recall the name I advised it was very important to follow up for her leg rash and her anal rash as she may need a biopsy  She voices understanding

## 2014-02-02 ENCOUNTER — Ambulatory Visit (INDEPENDENT_AMBULATORY_CARE_PROVIDER_SITE_OTHER): Payer: BC Managed Care – PPO | Admitting: Internal Medicine

## 2014-02-02 ENCOUNTER — Encounter: Payer: Self-pay | Admitting: Internal Medicine

## 2014-02-02 ENCOUNTER — Ambulatory Visit (HOSPITAL_BASED_OUTPATIENT_CLINIC_OR_DEPARTMENT_OTHER)
Admission: RE | Admit: 2014-02-02 | Discharge: 2014-02-02 | Disposition: A | Discharge status: Home / Self Care | Payer: BC Managed Care – PPO | Source: Ambulatory Visit | Attending: Internal Medicine | Admitting: Internal Medicine

## 2014-02-02 VITALS — BP 111/72 | HR 76 | Resp 16 | Ht 66.0 in | Wt 296.0 lb

## 2014-02-02 DIAGNOSIS — M7989 Other specified soft tissue disorders: Secondary | ICD-10-CM | POA: Insufficient documentation

## 2014-02-02 DIAGNOSIS — R609 Edema, unspecified: Secondary | ICD-10-CM

## 2014-02-02 DIAGNOSIS — R635 Abnormal weight gain: Secondary | ICD-10-CM

## 2014-02-02 DIAGNOSIS — E282 Polycystic ovarian syndrome: Secondary | ICD-10-CM

## 2014-02-02 LAB — CBC WITH DIFFERENTIAL/PLATELET
BASOS ABS: 0 10*3/uL (ref 0.0–0.1)
Basophils Relative: 0 % (ref 0–1)
EOS ABS: 0.2 10*3/uL (ref 0.0–0.7)
Eosinophils Relative: 2 % (ref 0–5)
HCT: 39.4 % (ref 36.0–46.0)
Hemoglobin: 13.3 g/dL (ref 12.0–15.0)
Lymphocytes Relative: 31 % (ref 12–46)
Lymphs Abs: 3.3 10*3/uL (ref 0.7–4.0)
MCH: 30.4 pg (ref 26.0–34.0)
MCHC: 33.8 g/dL (ref 30.0–36.0)
MCV: 90 fL (ref 78.0–100.0)
Monocytes Absolute: 0.9 10*3/uL (ref 0.1–1.0)
Monocytes Relative: 8 % (ref 3–12)
NEUTROS ABS: 6.4 10*3/uL (ref 1.7–7.7)
NEUTROS PCT: 59 % (ref 43–77)
PLATELETS: 333 10*3/uL (ref 150–400)
RBC: 4.38 MIL/uL (ref 3.87–5.11)
RDW: 13.4 % (ref 11.5–15.5)
WBC: 10.8 10*3/uL — ABNORMAL HIGH (ref 4.0–10.5)

## 2014-02-02 LAB — COMPREHENSIVE METABOLIC PANEL
ALT: 19 U/L (ref 0–35)
AST: 17 U/L (ref 0–37)
Albumin: 3.7 g/dL (ref 3.5–5.2)
Alkaline Phosphatase: 92 U/L (ref 39–117)
BILIRUBIN TOTAL: 0.3 mg/dL (ref 0.2–1.2)
BUN: 11 mg/dL (ref 6–23)
CO2: 29 mEq/L (ref 19–32)
Calcium: 9 mg/dL (ref 8.4–10.5)
Chloride: 104 mEq/L (ref 96–112)
Creat: 0.69 mg/dL (ref 0.50–1.10)
GLUCOSE: 67 mg/dL — AB (ref 70–99)
Potassium: 4.4 mEq/L (ref 3.5–5.3)
SODIUM: 140 meq/L (ref 135–145)
Total Protein: 6.8 g/dL (ref 6.0–8.3)

## 2014-02-02 MED ORDER — HYDROCHLOROTHIAZIDE 12.5 MG PO CAPS: ORAL_CAPSULE | ORAL | Status: DC

## 2014-02-02 NOTE — Progress Notes (Signed)
   Subjective:    Patient ID: Sabrina Avery, female    DOB: 08/10/1976, 38 y.o.   MRN: [id number]  HPI Sabrina Avery is here for acute visit.    She is concerned about a 3 days history of Left LE edema up to her knee.  Minor calf pain.    She states she has noticed her hands swell as well  She  Reports she is now seeing Dr. April Avery for fertility treatments.  She has  PCOS and has gained considerable weight.  She recently started Pioglizatone and will be starting  Femara but has not picked up Femara as yet.    Review of Systems See HPI    Objective:   Physical Exam Physical Exam  Nursing note and vitals reviewed.  Constitutional: She is oriented to person, place, and time. She appears well-developed and well-nourished.  HENT:  Head: Normocephalic and atraumatic.  Cardiovascular: Normal rate and regular rhythm. Exam reveals no gallop and no friction rub.  No murmur heard.  Pulmonary/Chest: Breath sounds normal. She has no wheezes. She has no rales.  Ext  She has 1+ edema to level of knee in left leg.   Trace in right LE.  Neurological: She is alert and oriented to person, place, and time.  Skin: Skin is warm and dry.  Psychiatric: She has a normal mood and affect. Her behavior is normal.             Assessment & Plan:  LE edema  Likely due to Actos but since it is limited unilaterally to her left leg will get venous ultrasound today.   Morbid obesity  PCOS    Infertility    Addendum  Venous U/s neg will start HCtZ 12.5 mg qod for 2 weeks.  ADvised pt to see me in 3-4 weeks

## 2014-02-03 LAB — T3, FREE: T3, Free: 2.6 pg/mL (ref 2.3–4.2)

## 2014-02-03 LAB — TSH: TSH: 1.005 u[IU]/mL (ref 0.350–4.500)

## 2014-02-03 LAB — T4, FREE: FREE T4: 1.11 ng/dL (ref 0.80–1.80)

## 2014-02-06 ENCOUNTER — Encounter: Payer: Self-pay | Admitting: *Deleted

## 2014-02-06 ENCOUNTER — Telehealth: Payer: Self-pay | Admitting: *Deleted

## 2014-02-06 DIAGNOSIS — R609 Edema, unspecified: Secondary | ICD-10-CM

## 2014-02-06 MED ORDER — HYDROCHLOROTHIAZIDE 12.5 MG PO CAPS: ORAL_CAPSULE | ORAL | Status: DC

## 2014-02-06 NOTE — Telephone Encounter (Signed)
Resent HCTZ to correct pharm per pt req

## 2014-02-13 ENCOUNTER — Telehealth: Payer: Self-pay | Admitting: *Deleted

## 2014-02-13 NOTE — Telephone Encounter (Signed)
Returned pt call pt was concerned about abnormal lab results. Advised pt that labs were normal

## 2014-02-13 NOTE — Telephone Encounter (Signed)
Sabrina Avery called she has questions about her test results that she received in mail.

## 2014-03-02 ENCOUNTER — Ambulatory Visit (INDEPENDENT_AMBULATORY_CARE_PROVIDER_SITE_OTHER): Payer: BC Managed Care – PPO | Admitting: Internal Medicine

## 2014-03-02 DIAGNOSIS — R609 Edema, unspecified: Secondary | ICD-10-CM

## 2014-03-02 DIAGNOSIS — G4733 Obstructive sleep apnea (adult) (pediatric): Secondary | ICD-10-CM

## 2014-03-02 LAB — BASIC METABOLIC PANEL
BUN: 10 mg/dL (ref 6–23)
CHLORIDE: 102 meq/L (ref 96–112)
CO2: 26 mEq/L (ref 19–32)
Calcium: 8.8 mg/dL (ref 8.4–10.5)
Creat: 0.75 mg/dL (ref 0.50–1.10)
Glucose, Bld: 57 mg/dL — ABNORMAL LOW (ref 70–99)
Potassium: 4.2 mEq/L (ref 3.5–5.3)
Sodium: 138 mEq/L (ref 135–145)

## 2014-03-02 NOTE — Patient Instructions (Signed)
See me in 6 months  Will refer to pulmonary

## 2014-03-02 NOTE — Progress Notes (Signed)
Subjective:    Patient ID: Sabrina Avery, female    DOB: 12/20/1976, 38 y.o.   MRN: [id number]  HPI  Sabrina Avery is here for follow up of Le edema.  She has been taking HCTZ qod and edema is resolved   Venous U/Sneg for DVT   Sheis on ACTos as treatment for her PCOS  She has a history of OSA and has not been using her CPAP - she returned her machine.   Noted increased snoring and apneic episode on her FIt Bit  No Known Allergies Past Medical History  Diagnosis Date  . Insulin resistance   . Sleep apnea   . Lumbar disc herniation     per pt report  . Polycystic ovarian syndrome   . Sleep apnea syndrome   . Anemia   . Infertility    No past surgical history on file. History   Social History  . Marital Status: Married    Spouse Name: N/A    Number of Children: N/A  . Years of Education: N/A   Occupational History  . Not on file.   Social History Main Topics  . Smoking status: Never Smoker   . Smokeless tobacco: Never Used  . Alcohol Use: No     Comment: occasionally wine  . Drug Use: No  . Sexual Activity: Yes    Birth Control/ Protection: None   Other Topics Concern  . Not on file   Social History Narrative  . No narrative on file   Family History  Problem Relation Age of Onset  . Hyperlipidemia Mother   . Thyroid disease Mother   . Diabetes Father   . Hypertension Brother   . Diabetes Paternal Grandmother   . Heart failure Paternal Grandmother    Patient Active Problem List   Diagnosis Date Noted  . Edema 03/02/2014  . Pain in joint, shoulder region 01/31/2013  . Rash and nonspecific skin eruption 01/31/2013  . Eczema 11/16/2012  . Elevated alkaline phosphatase measurement 11/07/2012  . Elevated uric acid in blood 10/19/2012  . Knee pain 04/22/2012  . Intertrigo 01/21/2012  . Infertility, female 11/20/2011  . Morbid obesity 11/19/2011  . Sleep apnea   . Lumbar disc herniation   . Polycystic ovarian syndrome    Current Outpatient Prescriptions on  File Prior to Visit  Medication Sig Dispense Refill  . calcium-vitamin D (OSCAL WITH D) 500-200 MG-UNIT per tablet Take 1 tablet by mouth continuous as needed.      . clomiPHENE (CLOMID) 50 MG tablet 2 tablets daily starting 07/31/12 x 5 days  10 tablet  0  . collodion LIQD Apply topically as needed.      . fish oil-omega-3 fatty acids 1000 MG capsule Take 1 g by mouth continuous as needed.      . folic acid (FOLVITE) 400 MCG tablet Take 400 mcg by mouth continuous as needed.      . Grapefruit Seed 60 % EXTR by Does not apply route continuous as needed.      . Iron-Vitamins (GERITOL PO) Take by mouth continuous as needed.      . lactobacillus acidophilus (BACID) TABS Take 1 tablet by mouth continuous as needed.      Marland Kitchen. letrozole (FEMARA) 2.5 MG tablet       . medroxyPROGESTERone (PROVERA) 10 MG tablet       . MOLYBDENUM PO Take by mouth continuous as needed.      . Multiple Vitamin (MULTIVITAMIN) tablet Take 1 tablet by  mouth daily.        . nabumetone (RELAFEN) 500 MG tablet Take 1 tablet (500 mg total) by mouth 2 (two) times daily.  30 tablet  0  . pioglitazone (ACTOS) 30 MG tablet       . Capsicum, Cayenne, (CAYENNE PEPPER) 450 MG CAPS Take by mouth continuous as needed.      . hydrochlorothiazide (MICROZIDE) 12.5 MG capsule Take one tablet every other day for 2 weeks  7 capsule  0  . hydrocortisone 2.5 % cream Apply topically 2 (two) times daily.  30 g  0  . nystatin-triamcinolone ointment (MYCOLOG) Apply topically 2 (two) times daily.  30 g  0  . Parsley Leaves POWD by Does not apply route continuous as needed.      . Prenatal Vit-Fe Fumarate-FA (PRENATAL MULTIVITAMIN) TABS Take 1 tablet by mouth continuous as needed.      . progesterone (PROMETRIUM) 200 MG capsule Take 2 tablets by mouth Daily. For first 10 days of cycle      . vitamin B-12 (CYANOCOBALAMIN) 50 MCG tablet Take 50 mcg by mouth once as needed.       No current facility-administered medications on file prior to visit.       Review of Systems See HPI    Objective:   Physical Exam Physical Exam  Nursing note and vitals reviewed.  Constitutional: She is oriented to person, place, and time. She appears well-developed and well-nourished.  HENT:  Head: Normocephalic and atraumatic.  Cardiovascular: Normal rate and regular rhythm. Exam reveals no gallop and no friction rub.  No murmur heard.  Pulmonary/Chest: Breath sounds normal. She has no wheezes. She has no rales.  Neurological: She is alert and oriented to person, place, and time.  Skin: Skin is warm and dry.  Ext:  No edema Psychiatric: She has a normal mood and affect. Her behavior is normal.             Assessment & Plan:  Le edema  Likely culprit Actos.  OK to continue HCTZ qod  Check K today.  Will discuss Actos dosing with her reproductive endocrinologist.    OSA  Will refer to pulmonary    See me in 6 months or prn

## 2014-03-06 ENCOUNTER — Encounter: Payer: Self-pay | Admitting: *Deleted

## 2014-05-04 ENCOUNTER — Ambulatory Visit: Payer: BC Managed Care – PPO | Admitting: Internal Medicine

## 2014-05-04 ENCOUNTER — Encounter: Payer: Self-pay | Admitting: Pulmonary Disease

## 2014-05-04 ENCOUNTER — Ambulatory Visit (INDEPENDENT_AMBULATORY_CARE_PROVIDER_SITE_OTHER): Payer: BC Managed Care – PPO | Admitting: Pulmonary Disease

## 2014-05-04 VITALS — BP 132/78 | HR 70 | Temp 98.7°F | Ht 66.0 in | Wt 299.8 lb

## 2014-05-04 DIAGNOSIS — G4733 Obstructive sleep apnea (adult) (pediatric): Secondary | ICD-10-CM

## 2014-05-04 NOTE — Assessment & Plan Note (Signed)
CPAP titration study will be scheduled Appt after you are set up with cpap  Weight loss encouraged, compliance with goal of at least 4-6 hrs every night is the expectation. Advised against medications with sedative side effects Cautioned against driving when sleepy - understanding that sleepiness will vary on a day to day basis

## 2014-05-04 NOTE — Patient Instructions (Signed)
CPAP titration study will be scheduled Appt after you are set up with cpap

## 2014-05-04 NOTE — Progress Notes (Signed)
Subjective:    Patient ID: Sabrina Avery, female    DOB: 05/23/1976, 38 y.o.   MRN: [id number]  HPI  38 y.o obese woman with polycystic ovarian syndrome referred for evaluation of sleep disordered breathing Baseline PSg showed AHI 15/h , wt 279 lbs, corrected by CPAP 10 cm (03/2010) with a medium full facemask. No supine REM sleep was noted. She has gained about 35 pounds since. Epworth sleepiness score is 16 or 24 and she report sleepiness and radius social situations. Bedtime is around 10 PM, the TV stays on through the night and she does on and off, until about midnight when she actually falls asleep, she sleeps on her side on her back with 2 pillows, reports one to 2 nocturnal awakenings and is out of bed by 6 AM feeling tired with occasional dryness of mouth. She reports 2 caffeinated drinks daily. There is no history suggestive of cataplexy, sleep paralysis or parasomnias   Past Medical History  Diagnosis Date  . Insulin resistance   . Sleep apnea   . Lumbar disc herniation     per pt report  . Polycystic ovarian syndrome   . Sleep apnea syndrome   . Anemia   . Infertility     No past surgical history on file.  No Known Allergies   Ms. Babula had no medications administered during this visit.  History   Social History  . Marital Status: Married    Spouse Name: N/A    Number of Children: N/A  . Years of Education: N/A   Occupational History  . Not on file.   Social History Main Topics  . Smoking status: Never Smoker   . Smokeless tobacco: Never Used  . Alcohol Use: No     Comment: occasionally wine  . Drug Use: No  . Sexual Activity: Yes    Birth Control/ Protection: None   Other Topics Concern  . Not on file   Social History Narrative  . No narrative on file   Family History  Problem Relation Age of Onset  . Hyperlipidemia Mother   . Thyroid disease Mother   . Diabetes Father   . Hypertension Brother   . Diabetes Paternal Grandmother   . Heart  failure Paternal Grandmother      Review of Systems  Constitutional: Negative for fever and unexpected weight change.  HENT: Negative for congestion, dental problem, ear pain, nosebleeds, postnasal drip, rhinorrhea, sinus pressure, sneezing, sore throat and trouble swallowing.   Eyes: Negative for redness and itching.  Respiratory: Negative for cough, chest tightness, shortness of breath and wheezing.   Cardiovascular: Negative for palpitations and leg swelling.  Gastrointestinal: Negative for nausea and vomiting.  Genitourinary: Negative for dysuria.  Musculoskeletal: Negative for joint swelling.  Skin: Negative for rash.  Neurological: Negative for headaches.  Hematological: Does not bruise/bleed easily.  Psychiatric/Behavioral: Negative for dysphoric mood. The patient is not nervous/anxious.        Objective:   Physical Exam  Gen. Pleasant, obese, in no distress, normal affect ENT - no lesions, no post nasal drip, class 2-3 airway Neck: No JVD, no thyromegaly, no carotid bruits Lungs: no use of accessory muscles, no dullness to percussion, decreased without rales or rhonchi  Cardiovascular: Rhythm regular, heart sounds  normal, no murmurs or gallops, no peripheral edema Abdomen: soft and non-tender, no hepatosplenomegaly, BS normal. Musculoskeletal: No deformities, no cyanosis or clubbing Neuro:  alert, non focal, no tremors       Assessment &  Plan:

## 2014-05-11 ENCOUNTER — Encounter: Payer: Self-pay | Admitting: Internal Medicine

## 2014-05-11 ENCOUNTER — Ambulatory Visit (INDEPENDENT_AMBULATORY_CARE_PROVIDER_SITE_OTHER): Payer: BC Managed Care – PPO | Admitting: Internal Medicine

## 2014-05-11 DIAGNOSIS — G4733 Obstructive sleep apnea (adult) (pediatric): Secondary | ICD-10-CM

## 2014-05-11 DIAGNOSIS — E282 Polycystic ovarian syndrome: Secondary | ICD-10-CM

## 2014-05-11 MED ORDER — PHENTERMINE HCL 15 MG PO CAPS: ORAL_CAPSULE | ORAL | Status: DC

## 2014-05-11 NOTE — Progress Notes (Signed)
Subjective:    Patient ID: Sabrina Avery, female    DOB: 12/28/1975, 38 y.o.   MRN: [id number]  HPI  Sabrina Avery is here for discussion of RX meds for weight loss.  She has been to nutritionist,  Tried dietary changes , exercising but unable to lose weight.  She also has PCOS  She is undergoing eval OSA  She would like to try phentermine  Low dose  No Known Allergies Past Medical History  Diagnosis Date  . Insulin resistance   . Sleep apnea   . Lumbar disc herniation     per pt report  . Polycystic ovarian syndrome   . Sleep apnea syndrome   . Anemia   . Infertility    History reviewed. No pertinent past surgical history. History   Social History  . Marital Status: Married    Spouse Name: N/A    Number of Children: N/A  . Years of Education: N/A   Occupational History  . Not on file.   Social History Main Topics  . Smoking status: Never Smoker   . Smokeless tobacco: Never Used  . Alcohol Use: No     Comment: occasionally wine  . Drug Use: No  . Sexual Activity: Yes    Birth Control/ Protection: None   Other Topics Concern  . Not on file   Social History Narrative  . No narrative on file   Family History  Problem Relation Age of Onset  . Hyperlipidemia Mother   . Thyroid disease Mother   . Diabetes Father   . Hypertension Brother   . Diabetes Paternal Grandmother   . Heart failure Paternal Grandmother    Patient Active Problem List   Diagnosis Date Noted  . Edema 03/02/2014  . OSA (obstructive sleep apnea) 03/02/2014  . Pain in joint, shoulder region 01/31/2013  . Rash and nonspecific skin eruption 01/31/2013  . Eczema 11/16/2012  . Elevated alkaline phosphatase measurement 11/07/2012  . Elevated uric acid in blood 10/19/2012  . Knee pain 04/22/2012  . Intertrigo 01/21/2012  . Infertility, female 11/20/2011  . Morbid obesity 11/19/2011  . Lumbar disc herniation   . Polycystic ovarian syndrome    Current Outpatient Prescriptions on File Prior to  Visit  Medication Sig Dispense Refill  . calcium-vitamin D (OSCAL WITH D) 500-200 MG-UNIT per tablet Take 1 tablet by mouth continuous as needed.      . folic acid (FOLVITE) 400 MCG tablet Take 400 mcg by mouth daily.       . pioglitazone (ACTOS) 30 MG tablet       . Prenatal Vit-Fe Fumarate-FA (PRENATAL MULTIVITAMIN) TABS Take 1 tablet by mouth continuous as needed.      . progesterone (PROMETRIUM) 200 MG capsule Take 2 tablets by mouth Daily. For first 10 days of cycle      . Capsicum, Cayenne, (CAYENNE PEPPER) 450 MG CAPS Take by mouth continuous as needed.      . collodion LIQD Apply topically as needed.      . fish oil-omega-3 fatty acids 1000 MG capsule Take 1 g by mouth continuous as needed.      . Grapefruit Seed 60 % EXTR by Does not apply route continuous as needed.      . hydrocortisone 2.5 % cream Apply topically as needed.      . Iron-Vitamins (GERITOL PO) Take by mouth continuous as needed.      . lactobacillus acidophilus (BACID) TABS Take 1 tablet by mouth continuous as needed.      .Marland Kitchen  medroxyPROGESTERone (PROVERA) 10 MG tablet       . MOLYBDENUM PO Take by mouth continuous as needed.      Pennie Banter. Parsley Leaves POWD by Does not apply route continuous as needed.      . vitamin B-12 (CYANOCOBALAMIN) 50 MCG tablet Take 50 mcg by mouth once as needed.       No current facility-administered medications on file prior to visit.      Review of Systems See HPI    Objective:   Physical Exam Physical Exam  Nursing note and vitals reviewed.  Constitutional: She is oriented to person, place, and time. She appears well-developed and well-nourished.  HENT:  Head: Normocephalic and atraumatic.  Cardiovascular: Normal rate and regular rhythm. Exam reveals no gallop and no friction rub.  No murmur heard.  Pulmonary/Chest: Breath sounds normal. She has no wheezes. She has no rales.  Neurological: She is alert and oriented to person, place, and time.  Skin: Skin is warm and dry.    Psychiatric: She has a normal mood and affect. Her behavior is normal.         Assessment & Plan:  Morbid obesity with PCOS and OSA:  Discussed risk/benefit of stimulants for weight loss including elevated BP and tachycardia.  I am not opposed to trying low dose phentermine 15 mg for a 3 month period only.   Take 15 mg dail for 7 days then 30 mg .  See me in 30 days.    PCOS  Sleep disturbance  Undergoing eval for OSA

## 2014-05-25 ENCOUNTER — Encounter: Payer: Self-pay | Admitting: Internal Medicine

## 2014-05-25 ENCOUNTER — Ambulatory Visit (INDEPENDENT_AMBULATORY_CARE_PROVIDER_SITE_OTHER): Payer: BC Managed Care – PPO | Admitting: Internal Medicine

## 2014-05-25 VITALS — BP 117/75 | HR 68 | Temp 97.9°F | Resp 18 | Ht 66.0 in | Wt 297.0 lb

## 2014-05-25 DIAGNOSIS — G4733 Obstructive sleep apnea (adult) (pediatric): Secondary | ICD-10-CM

## 2014-05-25 DIAGNOSIS — Z0000 Encounter for general adult medical examination without abnormal findings: Secondary | ICD-10-CM

## 2014-05-25 DIAGNOSIS — Z124 Encounter for screening for malignant neoplasm of cervix: Secondary | ICD-10-CM

## 2014-05-25 DIAGNOSIS — R748 Abnormal levels of other serum enzymes: Secondary | ICD-10-CM

## 2014-05-25 DIAGNOSIS — Z1151 Encounter for screening for human papillomavirus (HPV): Secondary | ICD-10-CM

## 2014-05-25 DIAGNOSIS — E282 Polycystic ovarian syndrome: Secondary | ICD-10-CM

## 2014-05-25 LAB — POCT URINALYSIS DIPSTICK
Bilirubin, UA: NEGATIVE
Blood, UA: NEGATIVE
Glucose, UA: NEGATIVE
Ketones, UA: NEGATIVE
LEUKOCYTES UA: NEGATIVE
Nitrite, UA: NEGATIVE
PH UA: 6.5
PROTEIN UA: NEGATIVE
Spec Grav, UA: 1.015
Urobilinogen, UA: NEGATIVE

## 2014-05-25 MED ORDER — PHENTERMINE HCL 30 MG PO CAPS: 30.0000 mg | ORAL_CAPSULE | ORAL | Status: DC

## 2014-05-25 NOTE — Progress Notes (Signed)
Subjective:    Patient ID: Sabrina Avery, female    DOB: 11/10/1976, 38 y.o.   MRN: [id number]  HPI Sabrina Avery is here for CPE  Morbid obesity  She has lost 4# since starting pherntermine 2-3 weeks ago.  Tolerating well no palpitations  She is taking 30 mg qam  PCOS/Infertility  She sees specialist for this  No Known Allergies Past Medical History  Diagnosis Date  . Insulin resistance   . Sleep apnea   . Lumbar disc herniation     per pt report  . Polycystic ovarian syndrome   . Sleep apnea syndrome   . Anemia   . Infertility    History reviewed. No pertinent past surgical history. History   Social History  . Marital Status: Married    Spouse Name: N/A    Number of Children: N/A  . Years of Education: N/A   Occupational History  . Not on file.   Social History Main Topics  . Smoking status: Never Smoker   . Smokeless tobacco: Never Used  . Alcohol Use: No     Comment: occasionally wine  . Drug Use: No  . Sexual Activity: Yes    Birth Control/ Protection: None   Other Topics Concern  . Not on file   Social History Narrative  . No narrative on file   Family History  Problem Relation Age of Onset  . Hyperlipidemia Mother   . Thyroid disease Mother   . Diabetes Father   . Hypertension Brother   . Diabetes Paternal Grandmother   . Heart failure Paternal Grandmother    Patient Active Problem List   Diagnosis Date Noted  . Edema 03/02/2014  . OSA (obstructive sleep apnea) 03/02/2014  . Pain in joint, shoulder region 01/31/2013  . Rash and nonspecific skin eruption 01/31/2013  . Eczema 11/16/2012  . Elevated alkaline phosphatase measurement 11/07/2012  . Elevated uric acid in blood 10/19/2012  . Knee pain 04/22/2012  . Intertrigo 01/21/2012  . Infertility, female 11/20/2011  . Morbid obesity 11/19/2011  . Lumbar disc herniation   . Polycystic ovarian syndrome    Current Outpatient Prescriptions on File Prior to Visit  Medication Sig Dispense Refill  .  calcium-vitamin D (OSCAL WITH D) 500-200 MG-UNIT per tablet Take 1 tablet by mouth continuous as needed.      . folic acid (FOLVITE) 400 MCG tablet Take 400 mcg by mouth daily.       . Iron-Vitamins (GERITOL PO) Take by mouth continuous as needed.      . medroxyPROGESTERone (PROVERA) 10 MG tablet       . pioglitazone (ACTOS) 30 MG tablet       . Prenatal Vit-Fe Fumarate-FA (PRENATAL MULTIVITAMIN) TABS Take 1 tablet by mouth continuous as needed.      . Capsicum, Cayenne, (CAYENNE PEPPER) 450 MG CAPS Take by mouth continuous as needed.      . collodion LIQD Apply topically as needed.      . fish oil-omega-3 fatty acids 1000 MG capsule Take 1 g by mouth continuous as needed.      . Grapefruit Seed 60 % EXTR by Does not apply route continuous as needed.      . hydrocortisone 2.5 % cream Apply topically as needed.      . lactobacillus acidophilus (BACID) TABS Take 1 tablet by mouth continuous as needed.      Marland Kitchen. MOLYBDENUM PO Take by mouth continuous as needed.      Pennie Banter. Parsley Leaves  POWD by Does not apply route continuous as needed.      . progesterone (PROMETRIUM) 200 MG capsule Take 2 tablets by mouth Daily. For first 10 days of cycle      . vitamin B-12 (CYANOCOBALAMIN) 50 MCG tablet Take 50 mcg by mouth once as needed.       No current facility-administered medications on file prior to visit.       Review of Systems  Respiratory: Negative for cough, choking, chest tightness and shortness of breath.   Cardiovascular: Negative for chest pain, palpitations and leg swelling.  Gastrointestinal: Negative for abdominal pain.       Objective:   Physical Exam Physical Exam  Vital signs and nursing note reviewed  Constitutional: She is oriented to person, place, and time. She appears well-developed and well-nourished. She is cooperative.  HENT:  Head: Normocephalic and atraumatic.  Right Ear: Tympanic membrane normal.  Left Ear: Tympanic membrane normal.  Nose: Nose normal.    Mouth/Throat: Oropharynx is clear and moist and mucous membranes are normal. No oropharyngeal exudate or posterior oropharyngeal erythema.  Eyes: Conjunctivae and EOM are normal. Pupils are equal, round, and reactive to light.  Neck: Neck supple. No JVD present. Carotid bruit is not present. No mass and no thyromegaly present.  Cardiovascular: Regular rhythm, normal heart sounds, intact distal pulses and normal pulses.  Exam reveals no gallop and no friction rub.   No murmur heard. Pulses:      Dorsalis pedis pulses are 2+ on the right side, and 2+ on the left side.  Pulmonary/Chest: Breath sounds normal. She has no wheezes. She has no rhonchi. She has no rales. Right breast exhibits no mass, no nipple discharge and no skin change. Left breast exhibits no mass, no nipple discharge and no skin change.  Abdominal: Soft. Bowel sounds are normal. She exhibits no distension and no mass. There is no hepatosplenomegaly. There is no tenderness. There is no CVA tenderness.  Genitourinary: Rectum normal, vagina normal and uterus normal. Rectal exam shows no mass. Guaiac negative stool. No labial fusion. There is no lesion on the right labia. There is no lesion on the left labia. Cervix exhibits no motion tenderness. Right adnexum displays no mass, no tenderness and no fullness. Left adnexum displays no mass, no tenderness and no fullness. No erythema around the vagina.  Musculoskeletal:       No active synovitis to any joint.    Lymphadenopathy:       Right cervical: No superficial cervical adenopathy present.      Left cervical: No superficial cervical adenopathy present.       Right axillary: No pectoral and no lateral adenopathy present.       Left axillary: No pectoral and no lateral adenopathy present.      Right: No inguinal adenopathy present.       Left: No inguinal adenopathy present.  Neurological: She is alert and oriented to person, place, and time. She has normal strength and normal reflexes.  No cranial nerve deficit or sensory deficit. She displays a negative Romberg sign. Coordination and gait normal.  Skin: Skin is warm and dry. No abrasion, no bruising, no ecchymosis and no rash noted. No cyanosis. Nails show no clubbing.  Psychiatric: She has a normal mood and affect. Her speech is normal and behavior is normal.          Assessment & Plan:  Health Maintenance  See scanned sheet  Counseled regarding lung cancer screening guidelines  Pt is a non-smoker  Pap today  Morbid obesity  Ok for short course of phentermine   She is to see me in a few weeks for BP check  PCOS     OSA  On CPAP        Assessment & Plan:

## 2014-05-25 NOTE — Patient Instructions (Addendum)
To lab today  Will mail results to you  Keep next appt with me

## 2014-05-26 LAB — LIPID PANEL
CHOL/HDL RATIO: 2.6 ratio
CHOLESTEROL: 132 mg/dL (ref 0–200)
HDL: 50 mg/dL (ref >39)
LDL Cholesterol: 71 mg/dL (ref 0–99)
Triglycerides: 54 mg/dL (ref <150)
VLDL: 11 mg/dL (ref 0–40)

## 2014-05-26 LAB — VITAMIN D 25 HYDROXY (VIT D DEFICIENCY, FRACTURES): VIT D 25 HYDROXY: 32 ng/mL (ref 30–89)

## 2014-05-29 LAB — CYTOLOGY - PAP

## 2014-05-30 ENCOUNTER — Encounter: Payer: Self-pay | Admitting: *Deleted

## 2014-06-08 ENCOUNTER — Ambulatory Visit (INDEPENDENT_AMBULATORY_CARE_PROVIDER_SITE_OTHER): Payer: BC Managed Care – PPO | Admitting: Internal Medicine

## 2014-06-08 ENCOUNTER — Encounter: Payer: Self-pay | Admitting: Internal Medicine

## 2014-06-08 DIAGNOSIS — N979 Female infertility, unspecified: Secondary | ICD-10-CM

## 2014-06-08 DIAGNOSIS — E282 Polycystic ovarian syndrome: Secondary | ICD-10-CM

## 2014-06-08 NOTE — Patient Instructions (Signed)
Hold phentermine while acitvely trying to conceive  See me as needed

## 2014-06-08 NOTE — Progress Notes (Signed)
Subjective:    Patient ID: Sabrina Avery, female    DOB: 02/06/1976, 38 y.o.   MRN: [id number]  HPI  [name] is here for follow up .  She has stopped her phentermine as her fertility Md told her that she has ovulated 2 eggs.    She has lost a total of 6#  No palpitations .    No Known Allergies Past Medical History  Diagnosis Date  . Insulin resistance   . Sleep apnea   . Lumbar disc herniation     per pt report  . Polycystic ovarian syndrome   . Sleep apnea syndrome   . Anemia   . Infertility    History reviewed. No pertinent past surgical history. History   Social History  . Marital Status: Married    Spouse Name: N/A    Number of Children: N/A  . Years of Education: N/A   Occupational History  . Not on file.   Social History Main Topics  . Smoking status: Never Smoker   . Smokeless tobacco: Never Used  . Alcohol Use: No     Comment: occasionally wine  . Drug Use: No  . Sexual Activity: Yes    Birth Control/ Protection: None   Other Topics Concern  . Not on file   Social History Narrative  . No narrative on file   Family History  Problem Relation Age of Onset  . Hyperlipidemia Mother   . Thyroid disease Mother   . Diabetes Father   . Hypertension Brother   . Diabetes Paternal Grandmother   . Heart failure Paternal Grandmother    Patient Active Problem List   Diagnosis Date Noted  . Edema 03/02/2014  . OSA (obstructive sleep apnea) 03/02/2014  . Pain in joint, shoulder region 01/31/2013  . Rash and nonspecific skin eruption 01/31/2013  . Eczema 11/16/2012  . Elevated alkaline phosphatase measurement 11/07/2012  . Elevated uric acid in blood 10/19/2012  . Knee pain 04/22/2012  . Intertrigo 01/21/2012  . Infertility, female 11/20/2011  . Morbid obesity 11/19/2011  . Lumbar disc herniation   . Polycystic ovarian syndrome    Current Outpatient Prescriptions on File Prior to Visit  Medication Sig Dispense Refill  . calcium-vitamin D (OSCAL WITH  D) 500-200 MG-UNIT per tablet Take 1 tablet by mouth continuous as needed.      Marland Kitchen. CINNAMON PO Take by mouth daily.      . fish oil-omega-3 fatty acids 1000 MG capsule Take 1 g by mouth continuous as needed.      . folic acid (FOLVITE) 400 MCG tablet Take 400 mcg by mouth daily.       . Iron-Vitamins (GERITOL PO) Take by mouth continuous as needed.      Marland Kitchen. letrozole (FEMARA) 2.5 MG tablet Take 2.5 mg by mouth daily. Not taking medication right now will start in June      . Magnesium 300 MG CAPS Take 300 mg by mouth daily.      . phentermine 30 MG capsule Take 1 capsule (30 mg total) by mouth every morning.  30 capsule  0  . pioglitazone (ACTOS) 30 MG tablet       . Prenatal Vit-Fe Fumarate-FA (PRENATAL MULTIVITAMIN) TABS Take 1 tablet by mouth continuous as needed.      . progesterone (PROMETRIUM) 200 MG capsule Take 2 tablets by mouth Daily. For first 10 days of cycle       No current facility-administered medications on file prior to visit.  Review of Systems    see HPI Objective:   Physical Exam Physical Exam  Nursing note and vitals reviewed.   Repeat BP    116/76 Constitutional: She is oriented to person, place, and time. She appears well-developed and well-nourished.  HENT:  Head: Normocephalic and atraumatic.  Cardiovascular: Normal rate and regular rhythm. Exam reveals no gallop and no friction rub.  No murmur heard.  Pulmonary/Chest: Breath sounds normal. She has no wheezes. She has no rales.  Neurological: She is alert and oriented to person, place, and time.  Skin: Skin is warm and dry.  Psychiatric: She has a normal mood and affect. Her behavior is normal.             Assessment & Plan:  Morbid obesity  Advised to hold phentermine and will discontinue as she is actively trying to conceive.    PCOS/infertility  Managed by fertility specialist  See me as needed

## 2014-06-13 IMAGING — CR DG CHEST 2V
2 series · 2 of 2 positions shown · non-contrast
Comparison: None.

CLINICAL DATA: Productive cough, fever

CHEST - 2 VIEW

[w chest pa]
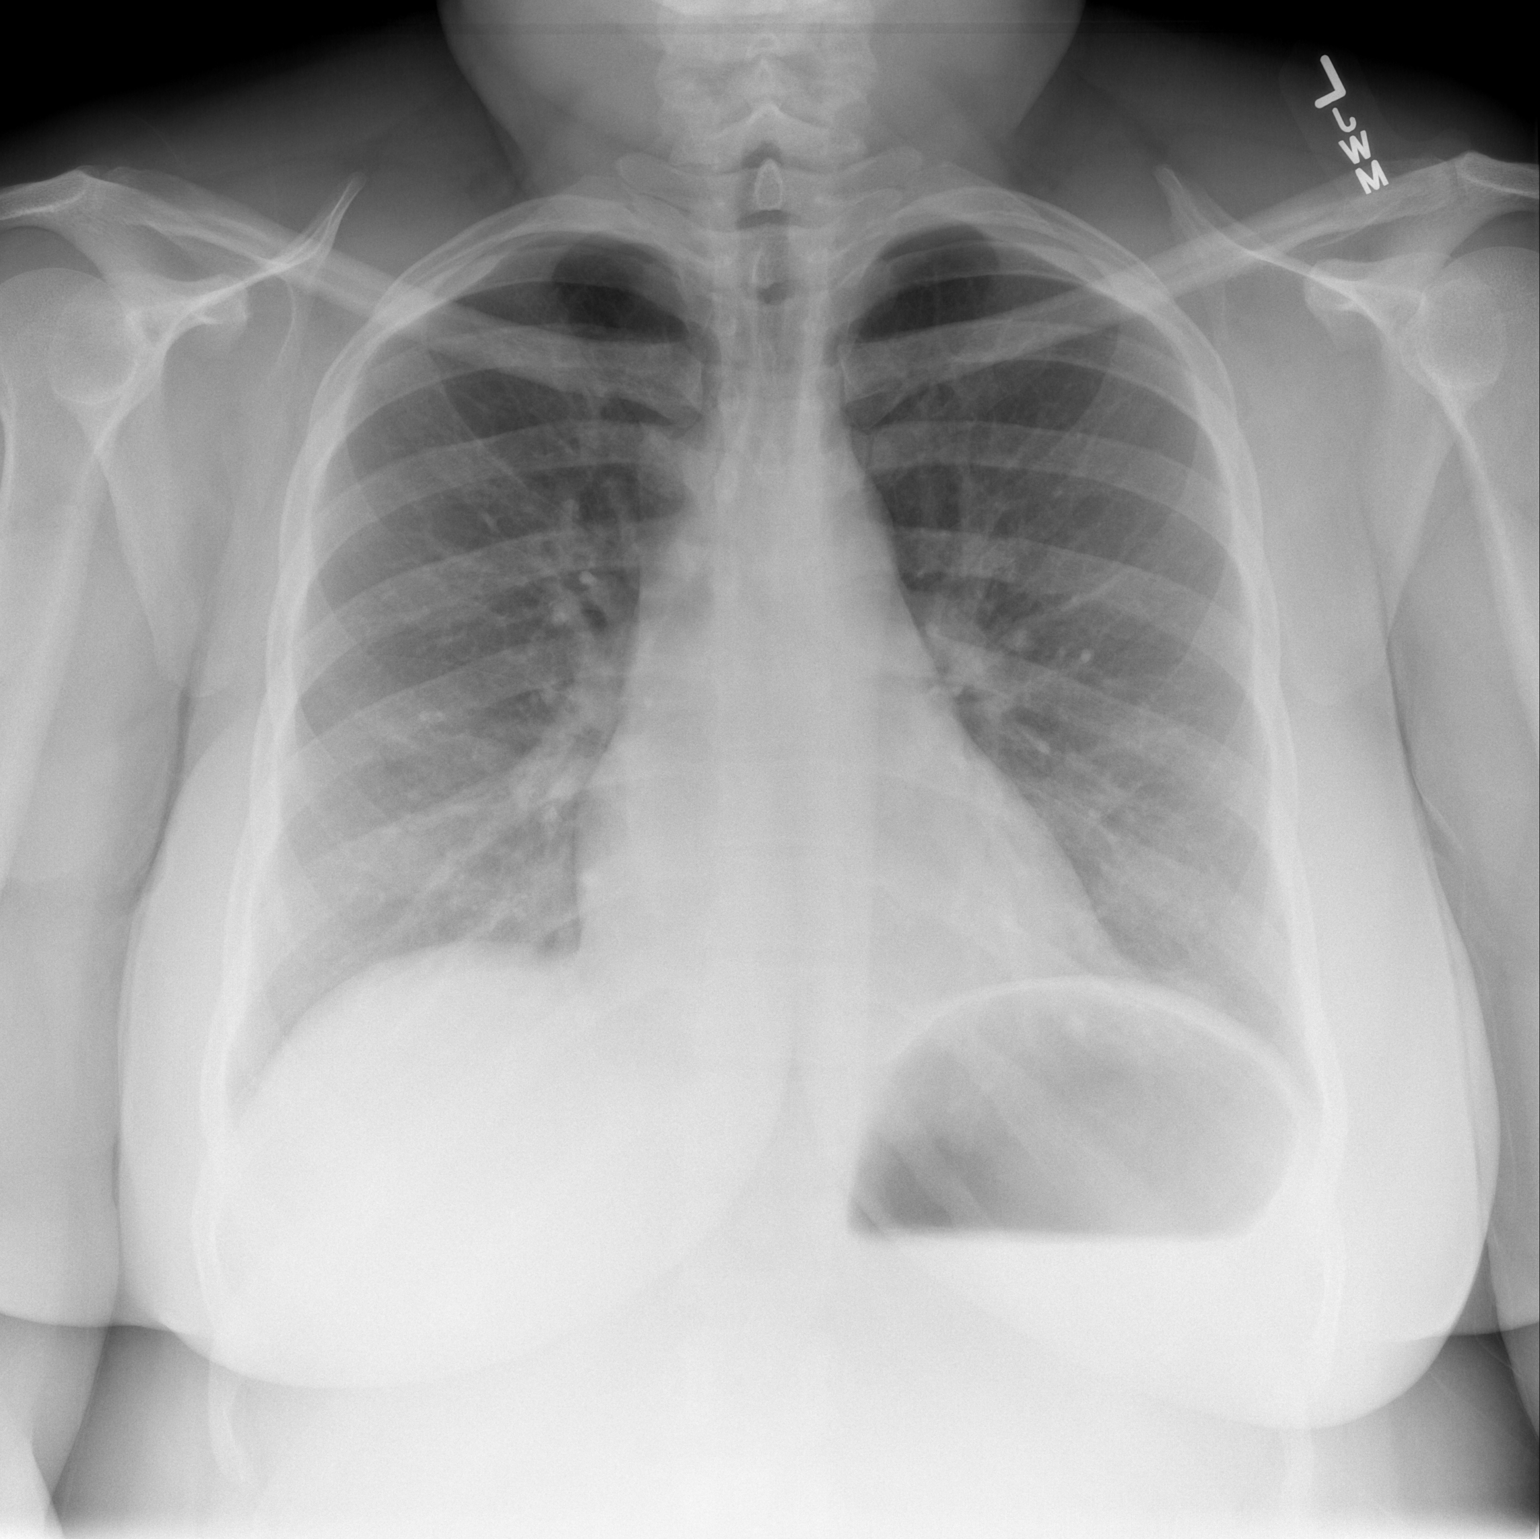

[w chest lat]
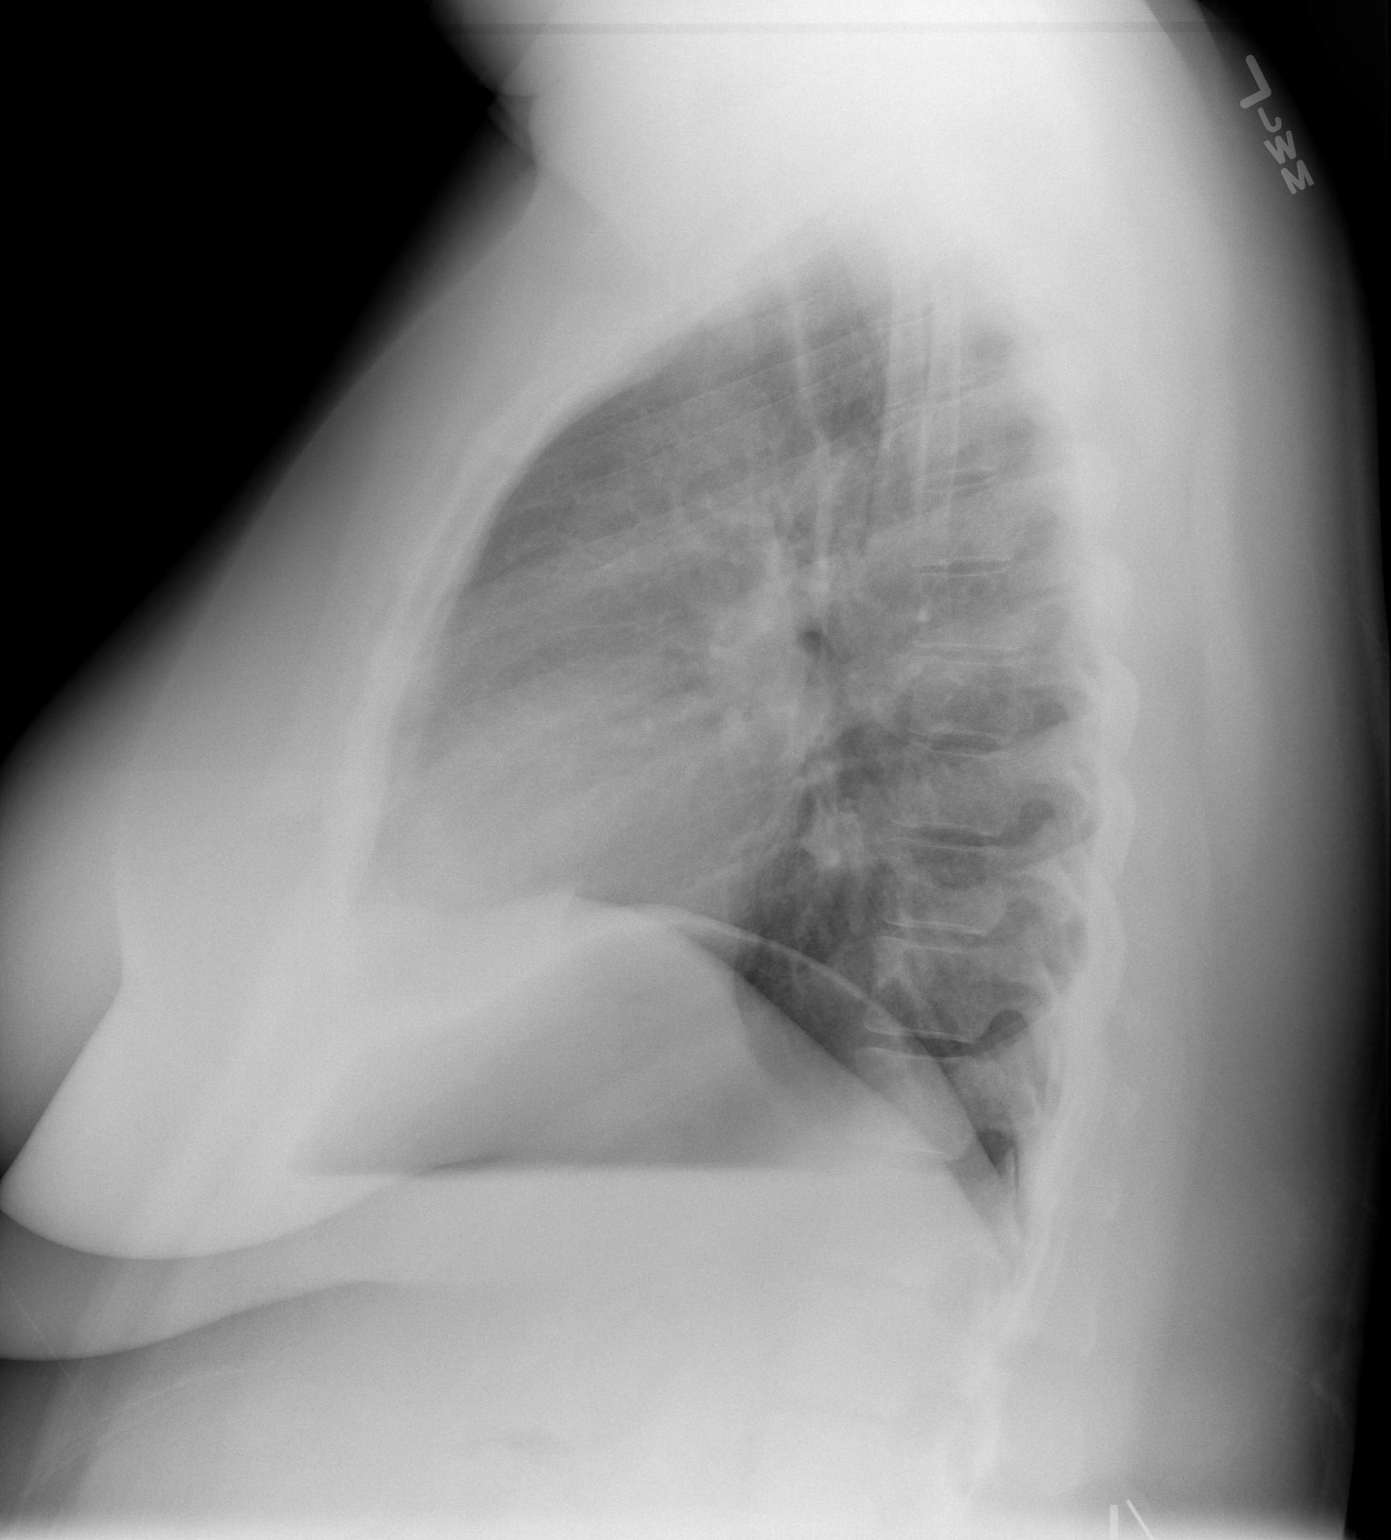

[2 of 2 positions shown; findings below may reference images not displayed]

FINDINGS: Cardiomediastinal silhouette is unremarkable.  No acute
infiltrate or pleural effusion.  No pulmonary edema.  Bony thorax
is unremarkable.
IMPRESSION: No active disease.

## 2014-06-19 ENCOUNTER — Encounter (HOSPITAL_BASED_OUTPATIENT_CLINIC_OR_DEPARTMENT_OTHER): Payer: BC Managed Care – PPO

## 2014-08-02 ENCOUNTER — Encounter (HOSPITAL_BASED_OUTPATIENT_CLINIC_OR_DEPARTMENT_OTHER): Payer: BC Managed Care – PPO

## 2014-08-04 ENCOUNTER — Ambulatory Visit (HOSPITAL_BASED_OUTPATIENT_CLINIC_OR_DEPARTMENT_OTHER): Payer: BC Managed Care – PPO | Attending: Pulmonary Disease | Admitting: Radiology

## 2014-08-04 VITALS — Ht 66.0 in

## 2014-08-04 DIAGNOSIS — G4733 Obstructive sleep apnea (adult) (pediatric): Secondary | ICD-10-CM | POA: Diagnosis present

## 2014-08-04 DIAGNOSIS — E282 Polycystic ovarian syndrome: Secondary | ICD-10-CM | POA: Insufficient documentation

## 2014-08-15 ENCOUNTER — Telehealth: Payer: Self-pay | Admitting: Pulmonary Disease

## 2014-08-15 DIAGNOSIS — G4730 Sleep apnea, unspecified: Secondary | ICD-10-CM

## 2014-08-15 DIAGNOSIS — G4733 Obstructive sleep apnea (adult) (pediatric): Secondary | ICD-10-CM

## 2014-08-15 DIAGNOSIS — G4710 Hypersomnia, unspecified: Secondary | ICD-10-CM

## 2014-08-15 NOTE — Telephone Encounter (Signed)
CPAP 11 cm with small nasal/oral mask Rx sent to DME Needs FU appt in 6 wks with download

## 2014-08-15 NOTE — Sleep Study (Signed)
[facility]  NAME: Sabrina Avery  DATE OF BIRTH: 1976-02-08  MEDICAL RECORD NUMBER [id number]  LOCATION: [facility]  PHYSICIAN: Caitlynn Ju V.  DATE OF STUDY:   SLEEP STUDY TYPE: CPAP titration study               REFERRING PHYSICIAN: Oretha Milch, MD  INDICATION FOR STUDY: 38 y.o obese woman with polycystic ovarian syndrome & OSA.Baseline PSG showed AHI 15/h , wt 279 lbs, corrected by CPAP 10 cm (03/2010) with a medium full facemask. No supine REM sleep was noted.  At the time of this study ,they weighed 299 pounds with a height of  5 ft 6 inches and the BMI of 48, neck size of 15 inches. Epworth sleepiness score was 17   This CPAP titration polysomnogram was performed with a sleep technologist in attendance. EEG, EOG,EMG and respiratory parameters recorded. Sleep stages, arousals, limb movements and respiratory data was scored according to criteria laid out by the American Academy of sleep medicine.  SLEEP ARCHITECTURE: Lights out was at 22 O2 PM and lights on was at 416 AM. Total sleep time was 290 minutes with sleep period time of 342 minutes and sleep efficiency of 78% .Sleep latency was 12 minutes with latency to REM sleep of 197 minutes and wake after sleep onset of 72 minutes.  Sleep stages as a percentage of total sleep time was N1 -9 %,N2- 81 % and REM sleep 10 % ( 29 minutes) . The longest period of REM sleep was around 2 AM.   AROUSAL DATA : There were 83 arousals with an arousal index of 17 events per hour. Of these 83 were spontaneous, and 0 were associated with respiratory events and 0 were associated periodic limb movements  RESPIRATORY DATA: CPAP was initiated at 5 centimeters and titrated to a final level of 11 centimeters due to respiratory events and snoring. At the final level of 11 centimeters for 75 minutes of sleep, there were 0 obstructive apneas, 0 central apneas, 0 mixed apneas and 0 hypopneas with apnea -hypopnea index of  0 events per hour.  There was no relation to sleep stage or body position. Titration was optimal although snoring persisted.  MOVEMENT/PARASOMNIA: There were 0 PLMS with a PLM index of 0 events per hour. The PLM arousal index was 0 events per hour.  OXYGEN DATA: The lowest desaturation was 93 % during non-REM sleep and the desaturation index was 0 per hour. The saturations stayed below 88% for 0 minutes.  CARDIAC DATA: The low heart rate was 51 beats per minute. The high heart rate recorded was an artifact. No arrhythmias were noted   IMPRESSION :  1. moderate obstructive sleep apnea with hypopneas causing sleep fragmentation and mild oxygen desaturation. 2. This was corrected by CPAP of 11 centimeters with a small nasal/oral mask. Titration was optimal. 3. No evidence of cardiac arrhythmias or behavioral disturbance during sleep. 4. Periodic limb movements were not noted.  RECOMMENDATION:    1. The treatment options for this degree of sleep disordered breathing includes weight loss and CPAP therapy. CPAP can be initiated at 11 centimeters with a small nasal/oral mask and compliance monitored at this level. 2. Patient should be cautioned against driving when sleepy 3. They should be asked to avoid medications with sedative side effects  Oretha Milch  MD Diplomate, American Board of Sleep Medicine  ELECTRONICALLY SIGNED ON:  08/15/2014  [facility] PH: [phone]  FX: [phone] [place]

## 2014-08-17 NOTE — Telephone Encounter (Signed)
Appt has been scheduled for 09/20/14 at 3:15, and pt aware of results and recs.  Nothing further needed.

## 2014-08-31 ENCOUNTER — Telehealth: Payer: Self-pay | Admitting: Pulmonary Disease

## 2014-08-31 NOTE — Telephone Encounter (Signed)
Pt called inquiring about CPAP order.  When I start investigating to find this Order, I realized it had been put in under Critical Care & even though we check this box several times a day, we had missed this order.  I routed Order to Pulmonary box & order was sent to [facility].  I had Terri @ Sleep Lab to fax the pt's recent Sleep Study directly to [facility] .  Also spoke with Barbara Cower & asked him to put a rush on this Order since it was not the pt's fault her CPAP order was delayed.  I contacted the pt & advised she should be contacted by [facility] within a day or two to get her CPAP set up.  I did apologize to her for the delay & also advised her that we need to change her 09/20/14 appt with Dr. Vassie Loll since she will not have a 1 month download available.  She stated she is going to call to reschedule that appt once she receives her CPAP because she will be out of town on 09/20/14 anyway Sabrina Avery

## 2014-08-31 NOTE — Telephone Encounter (Signed)
Order was placed 08/15/14. I can;t tell anything has been done with it. Please advise PCC's thanks

## 2014-08-31 NOTE — Telephone Encounter (Signed)
Pl make pt aware Send to different DME

## 2014-08-31 NOTE — Telephone Encounter (Signed)
Called [facility]-spoke with [employer]. Pt needs a diagnostic study done since pt had a lapse in CPAP. The study from 2011 is no longer valid. Not sure if insurance will cover home sleep study. Please advise thanks

## 2014-08-31 NOTE — Telephone Encounter (Signed)
Called pt LMTCB x1 Please advise PCC's thanks

## 2014-08-31 NOTE — Telephone Encounter (Signed)
lmtcb x1 for Sabrina Avery sleep study scanned in chart under media 04/11/10 is only study Avery had done other than the one did on 08/04/14.

## 2014-08-31 NOTE — Telephone Encounter (Signed)
Per Barbara Cower due to patient insurance new sleep study will have to be done.

## 2014-09-01 NOTE — Telephone Encounter (Signed)
Called, spoke with pt -  Explained below to her.  She verbalized understanding and is aware we will be contacting her with further recs. PCCs, please advise on how we should proceed from here --- Per below from RA, send to another DME.

## 2014-09-01 NOTE — Telephone Encounter (Signed)
Spoke with Sabrina Avery  & explained the situation.  I will fax records to [facility] for her to evaluate; she feels they can probably service this pt but will know for sure after seeing records. Spoke with pt & she is aware that records are being sent to [facility] & that they should contact her within about 3 business days [name]

## 2014-09-05 NOTE — Telephone Encounter (Signed)
Can this message be closed? Thanks. 

## 2014-09-20 ENCOUNTER — Ambulatory Visit: Payer: BC Managed Care – PPO | Admitting: Pulmonary Disease

## 2014-11-08 ENCOUNTER — Ambulatory Visit (INDEPENDENT_AMBULATORY_CARE_PROVIDER_SITE_OTHER): Payer: BC Managed Care – PPO | Admitting: Pulmonary Disease

## 2014-11-08 ENCOUNTER — Encounter: Payer: Self-pay | Admitting: Pulmonary Disease

## 2014-11-08 VITALS — BP 112/78 | HR 86 | Ht 66.0 in | Wt 288.4 lb

## 2014-11-08 DIAGNOSIS — G4733 Obstructive sleep apnea (adult) (pediatric): Secondary | ICD-10-CM

## 2014-11-08 NOTE — Patient Instructions (Signed)
CPAP is set at 11 cm, Very effective Usage of at least 6h/ night recommended

## 2014-11-08 NOTE — Progress Notes (Signed)
   Subjective:    Patient ID: Darden AmberNicole Schroeck, female    DOB: 04/17/1976, 38 y.o.   MRN: [id number]  HPI  38 y.o obese woman with polycystic ovarian syndrome for FU of OSA  Significant tests/ events  Baseline PSg - AHI 15/h , wt 279 lbs, corrected by CPAP 10 cm (03/2010) with a medium full facemask. No supine REM sleep was noted.   Chief Complaint  Patient presents with  . Follow-up    F/U OSA; wears CPAP approx 4 hours every night; making mouth dry; no other complaints; pt is 4 1/2 months pregnant   [redacted] wks pregnant, did not have nausea 1st trimester Lost from 299 - 288 over 9354m 08/2014 started on CPAP 11 cm with small FF mask  C/o dryness Sleeping ok x 4-5h per night Sleeps on side Download 10/2014 - no residuals on 11cm, avg leak Does not want flu shot  Review of Systems Pt denies any significant  nasal congestion or excess secretions, fever, chills, sweats, unintended wt loss, pleuritic or exertional cp, orthopnea pnd or leg swelling.  Pt also denies any obvious fluctuation in symptoms with weather or environmental change or other alleviating or aggravating factors.    Pt denies any increase in rescue therapy over baseline, denies waking up needing it or having early am exacerbations or coughing/wheezing/ or dyspnea      Objective:   Physical Exam  Gen. Pleasant, obese, in no distress ENT - no lesions, no post nasal drip Neck: No JVD, no thyromegaly, no carotid bruits Lungs: no use of accessory muscles, no dullness to percussion, decreased without rales or rhonchi  Cardiovascular: Rhythm regular, heart sounds  normal, no murmurs or gallops, no peripheral edema Musculoskeletal: No deformities, no cyanosis or clubbing , no tremors        Assessment & Plan:

## 2014-11-08 NOTE — Assessment & Plan Note (Signed)
CPAP is set at 11 cm, Very effective, small FF mask Usage of at least 6h/ night recommended rechk download in 3rd trimester with wt gain to make sure pressure optimal  Weight loss encouraged, compliance with goal of at least 4-6 hrs every night is the expectation. Advised against medications with sedative side effects Cautioned against driving when sleepy - understanding that sleepiness will vary on a day to day basis

## 2015-01-10 ENCOUNTER — Encounter: Payer: Self-pay | Admitting: *Deleted

## 2015-01-10 ENCOUNTER — Encounter: Payer: Self-pay | Admitting: Internal Medicine

## 2015-01-10 ENCOUNTER — Ambulatory Visit (INDEPENDENT_AMBULATORY_CARE_PROVIDER_SITE_OTHER): Payer: BLUE CROSS/BLUE SHIELD | Admitting: Internal Medicine

## 2015-01-10 VITALS — BP 126/71 | HR 109 | Resp 16 | Ht 66.0 in | Wt 283.0 lb

## 2015-01-10 DIAGNOSIS — E282 Polycystic ovarian syndrome: Secondary | ICD-10-CM

## 2015-01-10 DIAGNOSIS — G4733 Obstructive sleep apnea (adult) (pediatric): Secondary | ICD-10-CM | POA: Diagnosis not present

## 2015-01-10 NOTE — Progress Notes (Signed)
Subjective:    Patient ID: Sabrina AmberNicole Avery, female    DOB: 12/27/1975, 39 y.o.   MRN: [id number]  HPI 10/2014 pulmonary note  Expand All Collapse All   CPAP is set at 11 cm, Very effective, small FF mask Usage of at least 6h/ night recommended rechk download in 3rd trimester with wt gain to make sure pressure optimal  Weight loss encouraged, compliance with goal of at least 4-6 hrs every night is the expectation. Advised against medications with sedative side effects Cautioned against driving when sleepy - understanding that sleepiness will vary on a day to day basis     6/15 note Morbid obesity Advised to hold phentermine and will discontinue as she is actively trying to conceive.   PCOS/infertility Managed by fertility specialist  See me as needed    TODAY  Sabrina Avery is here for follow up.    She is planning to adopt and now is 6 months pregnant.  Pt reports no complications except bulging disc in back .  She is limited to lifting 20 lbs or less  Has been seeing Dr. Vassie LollAlva for OSA    No Known Allergies Past Medical History  Diagnosis Date  . Insulin resistance   . Sleep apnea   . Lumbar disc herniation     per pt report  . Polycystic ovarian syndrome   . Sleep apnea syndrome   . Anemia   . Infertility    No past surgical history on file. History   Social History  . Marital Status: Married    Spouse Name: N/A    Number of Children: N/A  . Years of Education: N/A   Occupational History  . Not on file.   Social History Main Topics  . Smoking status: Never Smoker   . Smokeless tobacco: Never Used  . Alcohol Use: No     Comment: occasionally wine  . Drug Use: No  . Sexual Activity: Yes    Birth Control/ Protection: None   Other Topics Concern  . Not on file   Social History Narrative   Family History  Problem Relation Age of Onset  . Hyperlipidemia Mother   . Thyroid disease Mother   . Diabetes Father   . Hypertension Brother   . Diabetes Paternal  Grandmother   . Heart failure Paternal Grandmother    Patient Active Problem List   Diagnosis Date Noted  . Edema 03/02/2014  . OSA (obstructive sleep apnea) 03/02/2014  . Pain in joint, shoulder region 01/31/2013  . Rash and nonspecific skin eruption 01/31/2013  . Eczema 11/16/2012  . Elevated alkaline phosphatase measurement 11/07/2012  . Elevated uric acid in blood 10/19/2012  . Knee pain 04/22/2012  . Intertrigo 01/21/2012  . Infertility, female 11/20/2011  . Morbid obesity 11/19/2011  . Lumbar disc herniation   . Polycystic ovarian syndrome    Current Outpatient Prescriptions on File Prior to Visit  Medication Sig Dispense Refill  . folic acid (FOLVITE) 400 MCG tablet Take 400 mcg by mouth daily.     . Magnesium 300 MG CAPS Take 300 mg by mouth daily.    . Prenatal Vit-Fe Fumarate-FA (PRENATAL MULTIVITAMIN) TABS Take 1 tablet by mouth continuous as needed.     No current facility-administered medications on file prior to visit.       Review of Systems See HPI    Objective:   Physical Exam  Physical Exam  Nursing note and vitals reviewed.  Constitutional: She is oriented to person, place,  and time. She appears well-developed and well-nourished.  HENT:  Head: Normocephalic and atraumatic.  Cardiovascular: Normal rate and regular rhythm. Exam reveals no gallop and no friction rub.  No murmur heard.  Pulmonary/Chest: Breath sounds normal. She has no wheezes. She has no rales.  Neurological: She is alert and oriented to person, place, and time.  Skin: Skin is warm and dry.  Psychiatric: She has a normal mood and affect. Her behavior is normal.             Assessment & Plan:  OSA   Continue per Dr. Reginia Naas recommendation   Morbid obesity  PCOS  Pregnancy

## 2015-03-07 IMAGING — US US EXTREM LOW VENOUS*L*
1 series · 14 of 18 positions shown · non-contrast
Comparison: None.

CLINICAL DATA: Left lower extremity swelling for 3 days.

EXAM:
LEFT LOWER EXTREMITY VENOUS DOPPLER ULTRASOUND
TECHNIQUE: Gray-scale sonography with graded compression, as well as color
Doppler and duplex ultrasound, were performed to evaluate the deep
venous system from the level of the common femoral vein through the
popliteal and proximal calf veins. Spectral Doppler was utilized to
evaluate flow at rest and with distal augmentation maneuvers.

[Series 1: us extrem low venous*left* · 14 of 18 slices shown]
[im 1/18]
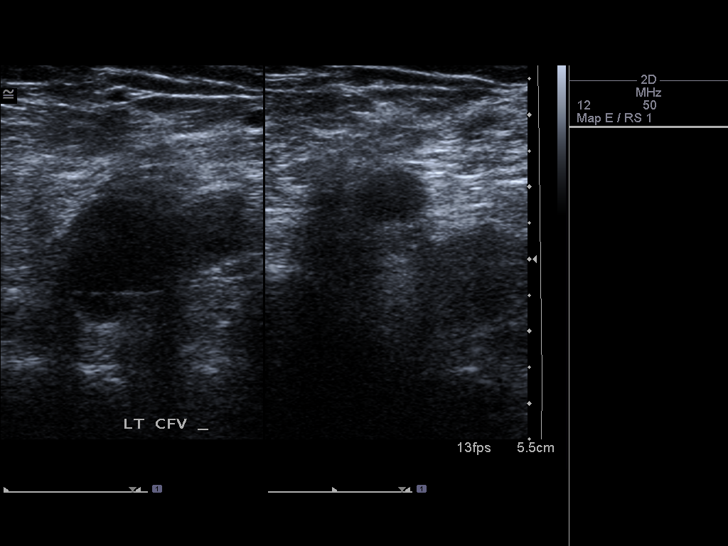
[im 2/18]
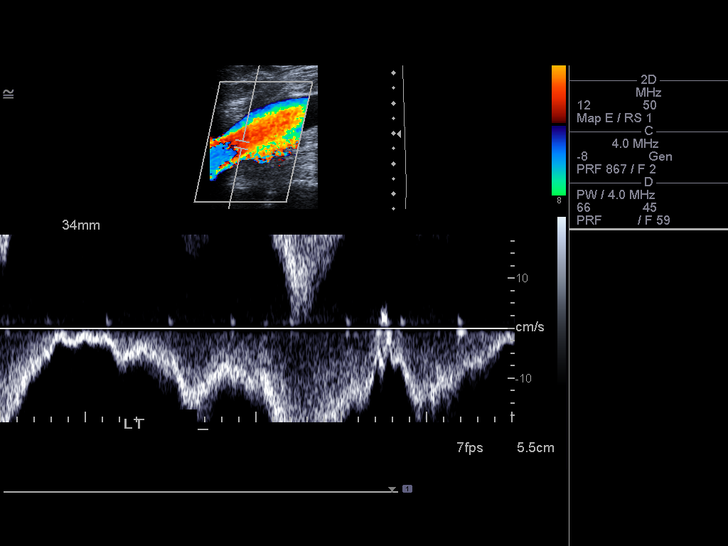
[im 4/18]
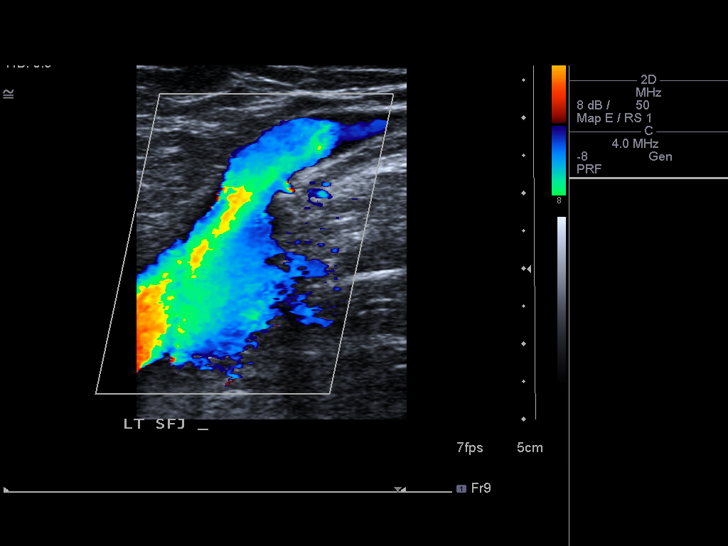
[im 5/18]
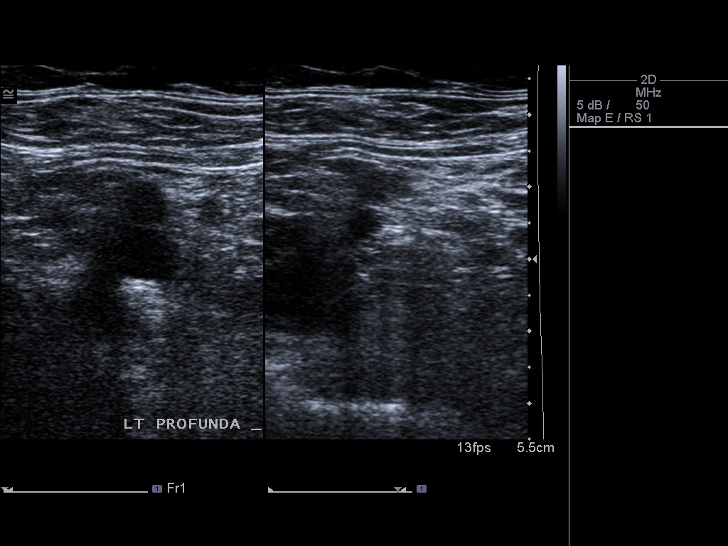
[im 6/18]
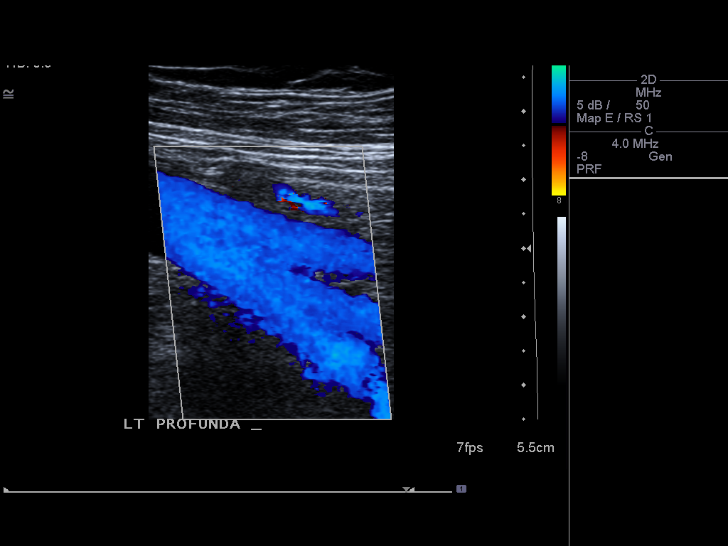
[im 8/18]
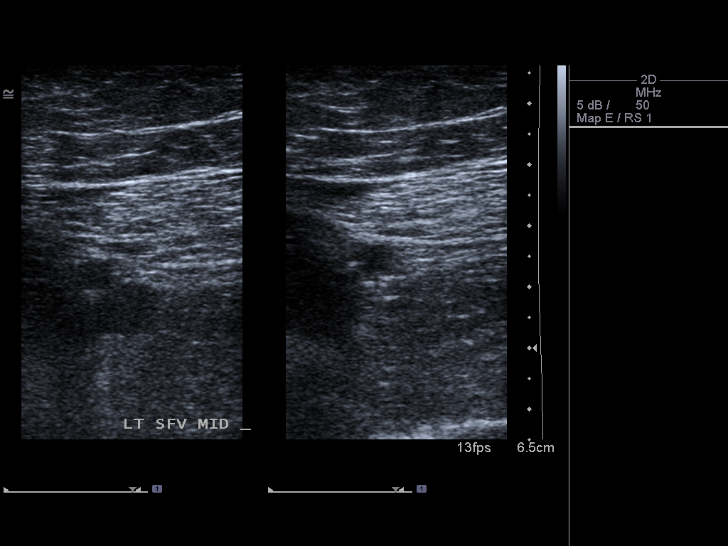
[im 9/18]
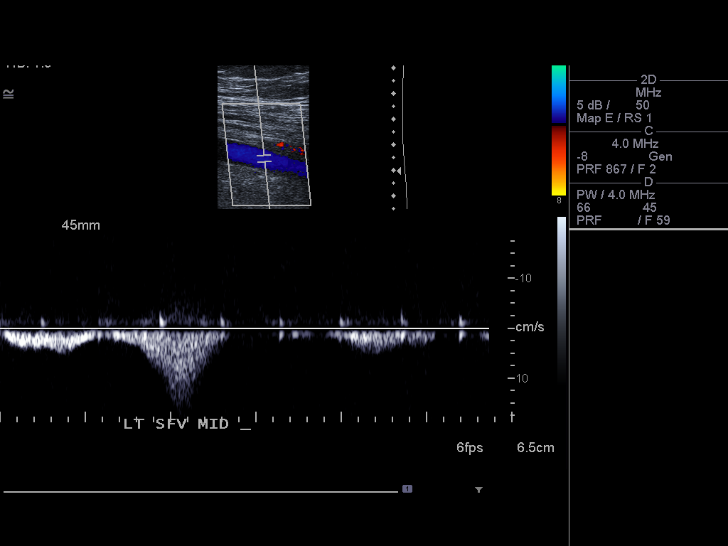
[im 10/18]
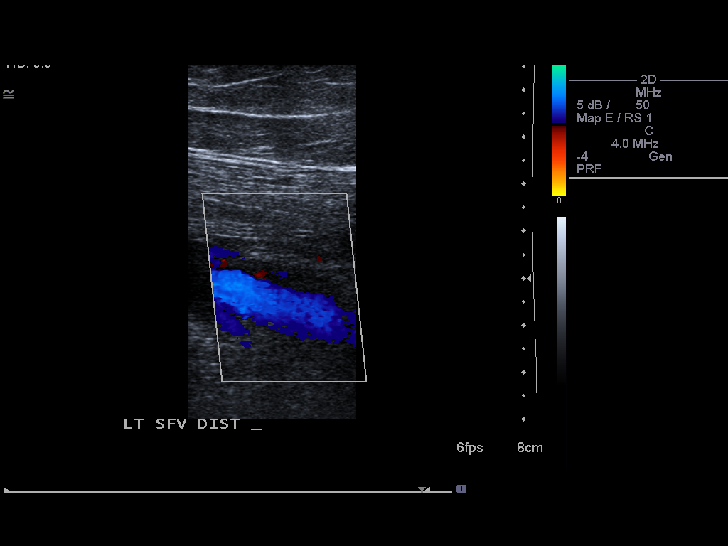
[im 11/18]
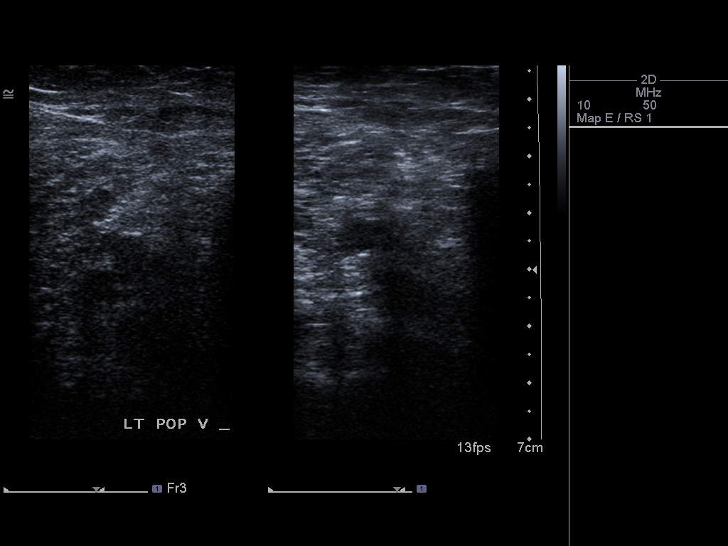
[im 13/18]
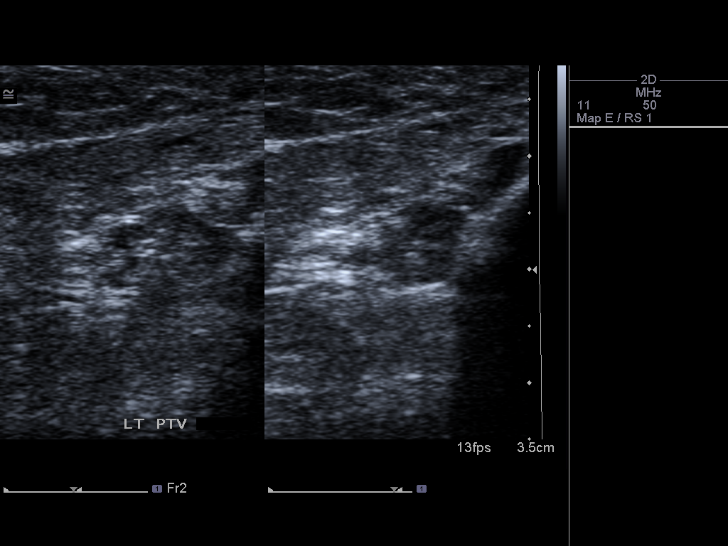
[im 14/18]
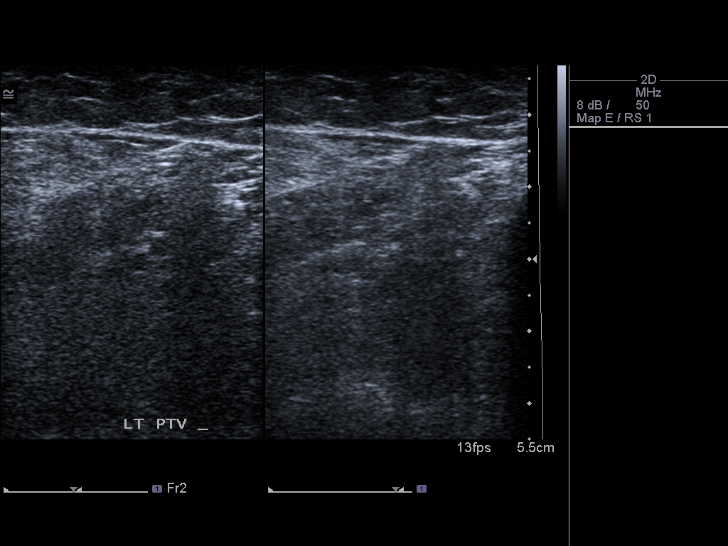
[im 15/18]
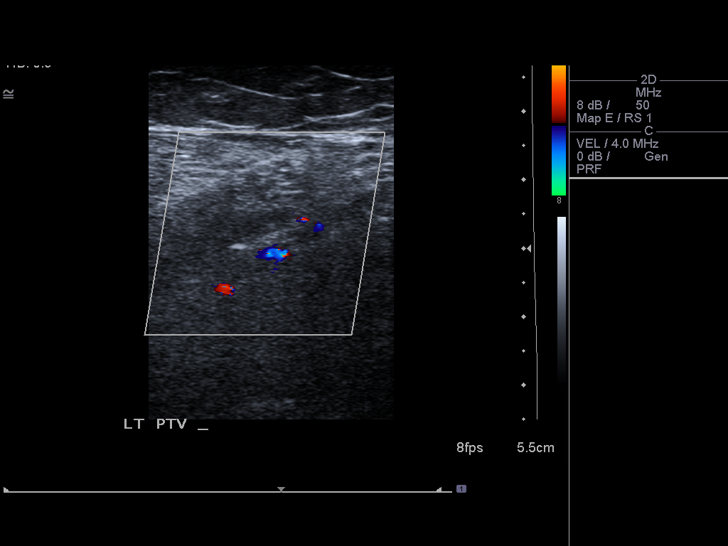
[im 17/18]
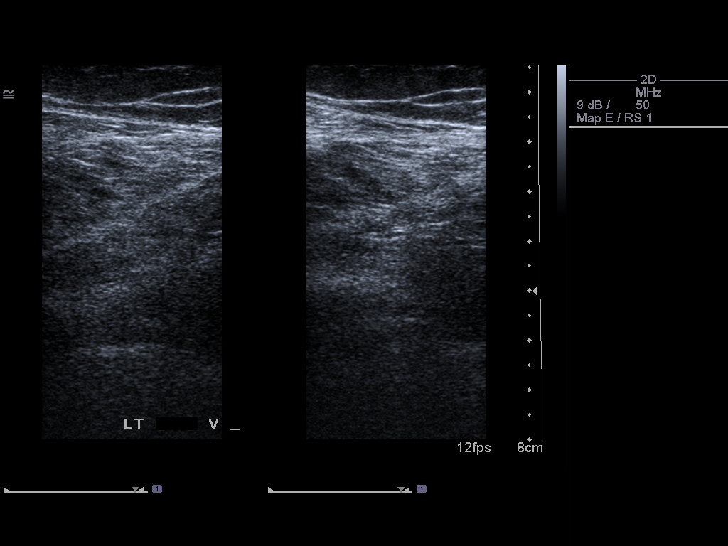
[im 18/18]
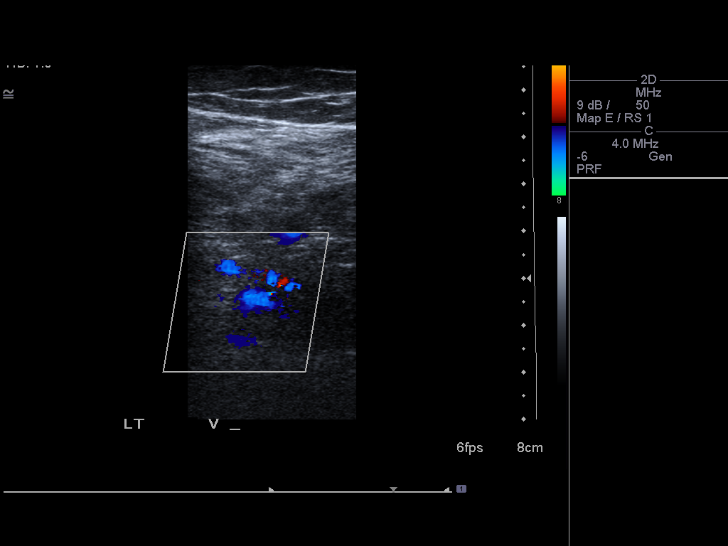

[14 of 18 positions shown; findings below may reference images not displayed]

FINDINGS: Thrombus within deep veins:  None visualized.

Compressibility of deep veins:  Normal.

Duplex waveform respiratory phasicity:  Normal.

Duplex waveform response to augmentation:  Normal.

Venous reflux:  None visualized.

Other findings:  None visualized.
IMPRESSION: Negative exam.

## 2015-06-04 ENCOUNTER — Encounter: Payer: Self-pay | Admitting: Internal Medicine
